# Patient Record
Sex: Female | Born: 1935 | Race: White | Hispanic: No | Marital: Single | Smoking: Current every day smoker
Health system: Southern US, Community
[De-identification: ages and names within clinical notes are randomized; demographics above are authoritative.]

## PROBLEM LIST (undated history)

## (undated) DIAGNOSIS — J449 Chronic obstructive pulmonary disease, unspecified: Secondary | ICD-10-CM

## (undated) DIAGNOSIS — H3530 Unspecified macular degeneration: Secondary | ICD-10-CM

## (undated) HISTORY — PX: NO PAST SURGERIES: SHX2092

## (undated) HISTORY — DX: Unspecified macular degeneration: H35.30

## (undated) HISTORY — DX: Chronic obstructive pulmonary disease, unspecified: J44.9

---

## 2001-02-08 ENCOUNTER — Inpatient Hospital Stay (HOSPITAL_COMMUNITY): Admission: EM | Admit: 2001-02-08 | Discharge: 2001-02-10 | Payer: Self-pay | Admitting: Emergency Medicine

## 2001-02-08 ENCOUNTER — Encounter: Payer: Self-pay | Admitting: Emergency Medicine

## 2003-04-25 ENCOUNTER — Ambulatory Visit (HOSPITAL_COMMUNITY): Admission: RE | Admit: 2003-04-25 | Discharge: 2003-04-25 | Payer: Self-pay | Admitting: Internal Medicine

## 2004-09-09 ENCOUNTER — Ambulatory Visit: Payer: Self-pay | Admitting: Internal Medicine

## 2004-09-16 ENCOUNTER — Ambulatory Visit: Payer: Self-pay | Admitting: Internal Medicine

## 2004-09-18 ENCOUNTER — Ambulatory Visit: Payer: Self-pay | Admitting: Family Medicine

## 2005-03-19 ENCOUNTER — Ambulatory Visit: Payer: Self-pay | Admitting: Internal Medicine

## 2005-03-25 ENCOUNTER — Ambulatory Visit: Payer: Self-pay | Admitting: Internal Medicine

## 2005-04-15 ENCOUNTER — Ambulatory Visit: Payer: Self-pay | Admitting: Internal Medicine

## 2005-05-13 ENCOUNTER — Ambulatory Visit: Payer: Self-pay | Admitting: *Deleted

## 2005-07-09 ENCOUNTER — Ambulatory Visit: Payer: Self-pay | Admitting: Internal Medicine

## 2005-10-27 ENCOUNTER — Ambulatory Visit: Payer: Self-pay | Admitting: Internal Medicine

## 2005-10-29 ENCOUNTER — Ambulatory Visit: Payer: Self-pay | Admitting: Cardiology

## 2005-11-06 ENCOUNTER — Ambulatory Visit: Payer: Self-pay | Admitting: Internal Medicine

## 2006-02-06 ENCOUNTER — Ambulatory Visit: Payer: Self-pay | Admitting: Internal Medicine

## 2006-05-11 ENCOUNTER — Ambulatory Visit: Payer: Self-pay | Admitting: Internal Medicine

## 2006-05-25 ENCOUNTER — Ambulatory Visit: Payer: Self-pay | Admitting: Internal Medicine

## 2006-05-26 LAB — CONVERTED CEMR LAB
ALT: 13 units/L (ref 0–40)
AST: 15 units/L (ref 0–37)
Albumin: 4.2 g/dL (ref 3.5–5.2)
Alkaline Phosphatase: 68 units/L (ref 39–117)
BUN: 12 mg/dL (ref 6–23)
Basophils Absolute: 0.1 10*3/uL (ref 0.0–0.1)
Basophils Relative: 1 % (ref 0.0–1.0)
Bilirubin, Direct: 0.1 mg/dL (ref 0.0–0.3)
CO2: 28 meq/L (ref 19–32)
Calcium: 10.1 mg/dL (ref 8.4–10.5)
Chloride: 105 meq/L (ref 96–112)
Creatinine, Ser: 0.7 mg/dL (ref 0.4–1.2)
Eosinophil percent: 1.6 % (ref 0.0–5.0)
GFR calc non Af Amer: 88 mL/min
Glomerular Filtration Rate, Af Am: 106 mL/min/{1.73_m2}
Glucose, Bld: 90 mg/dL (ref 70–99)
HCT: 32.4 % — ABNORMAL LOW (ref 36.0–46.0)
Hemoglobin: 10.3 g/dL — ABNORMAL LOW (ref 12.0–15.0)
Lymphocytes Relative: 17.8 % (ref 12.0–46.0)
MCHC: 31.8 g/dL (ref 30.0–36.0)
MCV: 75.7 fL — ABNORMAL LOW (ref 78.0–100.0)
Monocytes Absolute: 0.4 10*3/uL (ref 0.2–0.7)
Monocytes Relative: 4.2 % (ref 3.0–11.0)
Neutro Abs: 7 10*3/uL (ref 1.4–7.7)
Neutrophils Relative %: 75.4 % (ref 43.0–77.0)
Platelets: 382 10*3/uL (ref 150–400)
Potassium: 4.9 meq/L (ref 3.5–5.1)
RBC: 4.28 M/uL (ref 3.87–5.11)
RDW: 14.8 % — ABNORMAL HIGH (ref 11.5–14.6)
Sodium: 139 meq/L (ref 135–145)
Total Bilirubin: 0.7 mg/dL (ref 0.3–1.2)
Total Protein: 6.3 g/dL (ref 6.0–8.3)
WBC: 9.3 10*3/uL (ref 4.5–10.5)

## 2006-06-29 ENCOUNTER — Ambulatory Visit: Payer: Self-pay | Admitting: Internal Medicine

## 2007-01-28 ENCOUNTER — Encounter: Payer: Self-pay | Admitting: Internal Medicine

## 2007-02-19 DIAGNOSIS — M949 Disorder of cartilage, unspecified: Secondary | ICD-10-CM

## 2007-02-19 DIAGNOSIS — M899 Disorder of bone, unspecified: Secondary | ICD-10-CM | POA: Insufficient documentation

## 2007-02-19 DIAGNOSIS — D649 Anemia, unspecified: Secondary | ICD-10-CM | POA: Insufficient documentation

## 2007-02-19 DIAGNOSIS — F411 Generalized anxiety disorder: Secondary | ICD-10-CM | POA: Insufficient documentation

## 2007-02-19 DIAGNOSIS — E785 Hyperlipidemia, unspecified: Secondary | ICD-10-CM

## 2008-03-22 ENCOUNTER — Encounter: Payer: Self-pay | Admitting: Internal Medicine

## 2008-07-28 ENCOUNTER — Encounter: Payer: Self-pay | Admitting: Internal Medicine

## 2008-09-04 ENCOUNTER — Telehealth: Payer: Self-pay | Admitting: Family Medicine

## 2008-09-04 ENCOUNTER — Ambulatory Visit: Payer: Self-pay | Admitting: Family Medicine

## 2008-09-04 DIAGNOSIS — J0100 Acute maxillary sinusitis, unspecified: Secondary | ICD-10-CM | POA: Insufficient documentation

## 2010-11-01 NOTE — Discharge Summary (Signed)
[place]  Patient:    Lydia Carr, Lydia Carr Visit Number: [id number] MRN: [id number]          Service Type: MED Location: [phone] Attending Physician:  Judie Petit Dictated by:   Corwin Levins, M.D. [facility] Admit Date:  02/08/2001 Discharge Date: 02/10/2001                             Discharge Summary  DISCHARGE DIAGNOSES: 1. Anemia. 2. Lower gastrointestinal bleeding. 3. History of hemorrhoidectomy.  CONSULTATIONS:  Dr. Wilhemina Bonito. Eda Keys., Gastroenterology  PROCEDURE:  Colonoscopy, February 10, 2001, for internal hemorrhoids, otherwise normal examination without complication.  HISTORY OF PRESENT ILLNESS:  See that dictated by Dr. Cato Mulligan on the day of admission.  HOSPITAL COURSE:  Ms. Lydia Carr is a very nice 77 year old white female with history of hemorrhoidectomy and anemia secondary to hemorrhoidal bleeding requiring transfusion in 1997 with a colonoscopy at that time, otherwise essentially within normal limits. Since that time, she had occasional bright red blood per rectum and in the last two years, it seemed the bleeding has accelerated. She has been passing blood with almost all bowel movements sometimes three or four times a day. The blood kind of reddened the entire commode, sometime passed pure blood without stools.  She was admitted February 08, 2001 for the above with fatigue and anemia. Hemoglobin 6.4. She was transfused 2 units of packed red blood cells, found to be iron deficient on lab testing.  Gastroenterology consulted, colonoscopy performed with results as above.   On the 28th, after colonoscopy, she otherwise felt well with marked improvement.  On discharge, hemoglobin 9.8 and no other significant problems.   She was felt to have gained maximal benefit from this hospitalization and was to be discharged.  CONDITION ON DISCHARGE:  The patient was discharged to home in good condition.  DISCHARGE MEDICATIONS: 1.  Nu-Iron 150 mg 1-2 times per day. 2. Anusol-HC suppositories. 3. Metamucil on a daily basis.  INSTRUCTIONS:  She was instructed to eat iron-rich foods.  No strenuous exercise for 10 days.  Otherwise no other dietary or activity restrictions.  FOLLOW-UP:  She was asked to make a follow-up appointment with Dr. Cato Mulligan in two weeks to check her blood count and call Dr. Marina Goodell for any problems such as further persistent rectal bleeding.Dictated by:   Corwin Levins, M.D. [facility]  Attending Physician:  Judie Petit DD:  03/31/01 TD:  03/31/01 Job: [id number]

## 2010-11-01 NOTE — Assessment & Plan Note (Signed)
[facility]                         GASTROENTEROLOGY OFFICE NOTE   Lydia Carr, Lydia Carr                    MRN:          [id number]  DATE:06/29/2006                            DOB:          09-20-1932    REFERRING PHYSICIAN:  Valetta Mole. Swords, MD   REASON FOR CONSULTATION:  Change in bowel habits, rectal bleeding and  anemia.   HISTORY:  This is a 77 year old female with a history of recurrent iron  deficiency anemia and rectal bleeding due to hemorrhoids. For this  problem, she underwent complete colonoscopy in 1987 and again in 2002.  No abnormalities other than hemorrhoids noted. She was evaluated by Dr.  Ovidio Kin of general surgery for treatment though apparently did not  follow through. She reports being in her usual state of good health  until November 2007 when on two consecutive evenings after dining out  developed problems with diarrhea that persisted through the night. Since  that time, she has had persistent problems with increased intestinal  bowel sounds, increased intestinal gas with pain, urgency and diarrhea.  She has also noticed blood in the commode. Evaluation with Dr. Cato Mulligan  May 26, 2006 included blood work. She was anemic with a hemoglobin  of 10.3. Her indices were microcytic with an MCV of 75.7. She was  treated with iron b.i.d. She has also modified her diet avoiding milk  products and raw vegetables. She states that her condition has improved  somewhat. She has not had prior upper endoscopy. She has had some nausea  though no vomiting. No heartburn or dysphagia. She denies nonsteroidal  antiinflammatory drug use. No prior history of problems with her bowel  habits.   PAST MEDICAL HISTORY:  None.   PAST SURGICAL HISTORY:  Hysterectomy in 1969, hemorrhoid surgery in  1985.   ALLERGIES:  LANCOSIN.   CURRENT MEDICATIONS:  Levsin sublingual 0.125 mg p.r.n., multivitamin,  calcium and iron supplement b.i.d.   FAMILY HISTORY:  No family history of gastrointestinal malignancy or  inflammatory bowel disease.   SOCIAL HISTORY:  The patient is widowed with 2 sons who are grown, she  lives alone. She was a housewife. She smokes 3/4 of a pack of cigarettes  a day. She occasionally uses alcohol.   REVIEW OF SYSTEMS:  Per diagnostic evaluation form.   PHYSICAL EXAMINATION:  GENERAL:  Well-appearing female in no acute  distress.  VITAL SIGNS:  Blood pressure is 130/76, heart rate is 80 and regular.  Weight is 123 pounds. She is 5 feet 3 inches in height.  HEENT:  Sclera are anicteric. Conjunctiva are pink. Oral mucosa is  intact. There is no adenopathy.  LUNGS:  Clear.  HEART:  Regular.  ABDOMEN:  Soft without tenderness, mass or hernia. Good bowel sounds  heard.  EXTREMITIES:  Without edema.   IMPRESSION:  This is a 77 year old female who presents with a 2 month  history of change in bowel habits as manifested by diarrhea and urgency  as well as abdominal discomfort and rectal bleeding. She also has iron  deficiency anemia. New onset inflammatory bowel disease should be  excluded. Alternatively  this may represent a protracted infectious  process which is improving and the intermittent bleeding,and possibly  the anemia, simply due to known hemorrhoids as in the past.   RECOMMENDATIONS:  1. Colonoscopy to evaluate change in bowel habits, rectal bleeding and      iron deficiency anemia. The nature of the procedure as well as the      risks, benefits, and alternatives have been reviewed. She      understood and agreed to proceed.  2. If colonoscopy is not completely revealing then upper endoscopy      with small intestinal biopsies to evaluate iron deficiency anemia      and diarrhea (i.e. rule out sprue).     Wilhemina Bonito. Marina Goodell, MD  Electronically Signed    JNP/MedQ  DD: 06/29/2006  DT: 06/29/2006  Job #: [id number]   cc:   Valetta Mole. Swords, MD

## 2010-11-01 NOTE — H&P (Signed)
[facility]  Patient:    Lydia Carr, Lydia Carr Visit Number: [id number] MRN: [id number]          Service Type: MED Location: [phone] Attending Physician:  Judie Petit Dictated by:   Valetta Mole Swords, M.D. [facility] Adm. Date:  02/08/2001   CC:         Wilhemina Bonito. Eda Keys., M.D. [facility]   History and Physical  CHIEF COMPLAINT:  This is a 77 year old female with chief complaint of "feels lousy."  HISTORY OF PRESENT ILLNESS:  Ms. Tooley is a relatively healthy 77 year old female who has a two-year history of bright red blood per rectum.  She has not sought any medical attention for this.  She has a history of being profoundly anemic in 1987, which was thought to be secondary to bleeding hemorrhoids. Apparently, she had a colonoscopy at that time that was negative except for hemorrhoids.  She had a surgical procedure at that time.  Now, she has a two-year history of intermittent rectal bleeding.  The bleeding can be quite pronounced.  Over the past three to four days, she has felt excessively tired, fatigued and short of breath.  This has been a gradual worsening of her symptoms.  Now, she will feel lightheaded occasionally when she stands up.  PAST MEDICAL HISTORY: 1. Hemorrhoid surgery in 1987. 2. Knee arthroscopy. 3. Hysterectomy at age 60. 4. Bilateral salpingo-oophorectomy.  MEDICATIONS:  No medications.  No over-the-counter medications and no nonsteroidals.  SOCIAL HISTORY:  She is married and recently moved to [place].  She quit smoking two to three months ago.  She drinks alcohol rarely.  She has two healthy children.  FAMILY HISTORY:  Father deceased at 72.  Mother deceased at 19.  She has one brother who is healthy.  REVIEW OF SYSTEMS:  She denies any chest pain, PND, orthopnea, denies any abdominal pain and denies any other complaints other than those listed above.  PHYSICAL EXAMINATION:  VITAL SIGNS:  Blood pressure 126/59, pulse  107, respiratory rate 20, temperature 97.8.  GENERAL:  She appears as well-developed, well-nourished female in no acute distress, mildly pale-appearing.  HEENT:  Conjunctivae are pale.  Atraumatic, normocephalic, extraocular movements intact.  NECK:  Supple without lymphadenopathy, jugular venous distention or carotid bruits.  CHEST:  Clear to auscultation without any increased work of breathing.  CARDIAC:  S1, S2 are normal without murmurs or gallop.  ABDOMEN:  Active bowel sounds, soft, nontender.  There is no hepatosplenomegaly, no masses or palpated.  EXTREMITIES:  No clubbing, cyanosis, or edema.  RECTAL:  Heme positive stool.  NEUROLOGIC:  She is alert and oriented.  LABORATORY DATA AND X-RAY FINDINGS:  CMET is normal.  Hemoglobin 6.5, white count 9.4, hematocrit 20.2, platelet count 418,000.  Amylase 80.  EKG was performed and demonstrates normal sinus rhythm and is a normal EKG.  ASSESSMENT/PLAN:  Profound anemia as cause of her systemic symptoms.  Given her history, it is most likely that this is hemorrhoidal bleeding, although it seems like an excessive amount of bleeding.  She needs GI evaluation and likely needs a full colonoscopy.  I have talked with Dr. Yancey Flemings who will see the patient.  We will obtain iron studies to prove iron deficiency.  She needs transfusion given her symptoms.  She understands the risks and will do that tonight. Dictated by:   Valetta Mole Swords, M.D. [facility] Attending Physician:  Judie Petit DD:  02/08/01 TD:  02/08/01 Job: [id number]

## 2014-11-07 ENCOUNTER — Other Ambulatory Visit: Payer: Self-pay | Admitting: Family Medicine

## 2014-11-07 ENCOUNTER — Ambulatory Visit
Admission: RE | Admit: 2014-11-07 | Discharge: 2014-11-07 | Disposition: A | Discharge status: Home / Self Care | Payer: Medicare Other | Source: Ambulatory Visit | Attending: Family Medicine | Admitting: Family Medicine

## 2014-11-07 DIAGNOSIS — M545 Low back pain, unspecified: Secondary | ICD-10-CM

## 2014-11-08 ENCOUNTER — Ambulatory Visit
Admission: RE | Admit: 2014-11-08 | Discharge: 2014-11-08 | Disposition: A | Discharge status: Home / Self Care | Payer: Medicare Other | Source: Ambulatory Visit | Attending: Family Medicine | Admitting: Family Medicine

## 2014-11-08 DIAGNOSIS — M545 Low back pain: Secondary | ICD-10-CM

## 2014-11-15 ENCOUNTER — Other Ambulatory Visit: Payer: Self-pay | Admitting: Family Medicine

## 2014-11-15 DIAGNOSIS — M545 Low back pain, unspecified: Secondary | ICD-10-CM

## 2014-11-28 ENCOUNTER — Ambulatory Visit
Admission: RE | Admit: 2014-11-28 | Discharge: 2014-11-28 | Disposition: A | Discharge status: Home / Self Care | Payer: Medicare Other | Source: Ambulatory Visit | Attending: Family Medicine | Admitting: Family Medicine

## 2014-11-28 DIAGNOSIS — M79604 Pain in right leg: Secondary | ICD-10-CM

## 2014-11-28 DIAGNOSIS — M545 Low back pain: Secondary | ICD-10-CM

## 2014-11-28 MED ORDER — METHYLPREDNISOLONE ACETATE 40 MG/ML INJ SUSP (RADIOLOG
120.0000 mg | Freq: Once | INTRAMUSCULAR | Status: AC
  Administered 2014-11-28: 120 mg via EPIDURAL

## 2014-11-28 MED ORDER — IOHEXOL 180 MG/ML  SOLN
1.0000 mL | Freq: Once | INTRAMUSCULAR | Status: AC | PRN
  Administered 2014-11-28: 1 mL via EPIDURAL

## 2014-11-28 NOTE — Discharge Instructions (Signed)

## 2015-10-25 ENCOUNTER — Other Ambulatory Visit: Payer: Self-pay | Admitting: Family Medicine

## 2015-10-25 ENCOUNTER — Ambulatory Visit
Admission: RE | Admit: 2015-10-25 | Discharge: 2015-10-25 | Disposition: A | Discharge status: Home / Self Care | Payer: Medicare Other | Source: Ambulatory Visit | Attending: Family Medicine | Admitting: Family Medicine

## 2015-10-25 DIAGNOSIS — S42111D Displaced fracture of body of scapula, right shoulder, subsequent encounter for fracture with routine healing: Secondary | ICD-10-CM

## 2016-05-19 IMAGING — CR DG SCAPULA*R*
2 series · 2 of 2 positions shown · non-contrast
Comparison: None.

CLINICAL DATA: Palpable knot of right scapula.

EXAM:
RIGHT SCAPULA - 2+ VIEWS

[w scapula ap right]
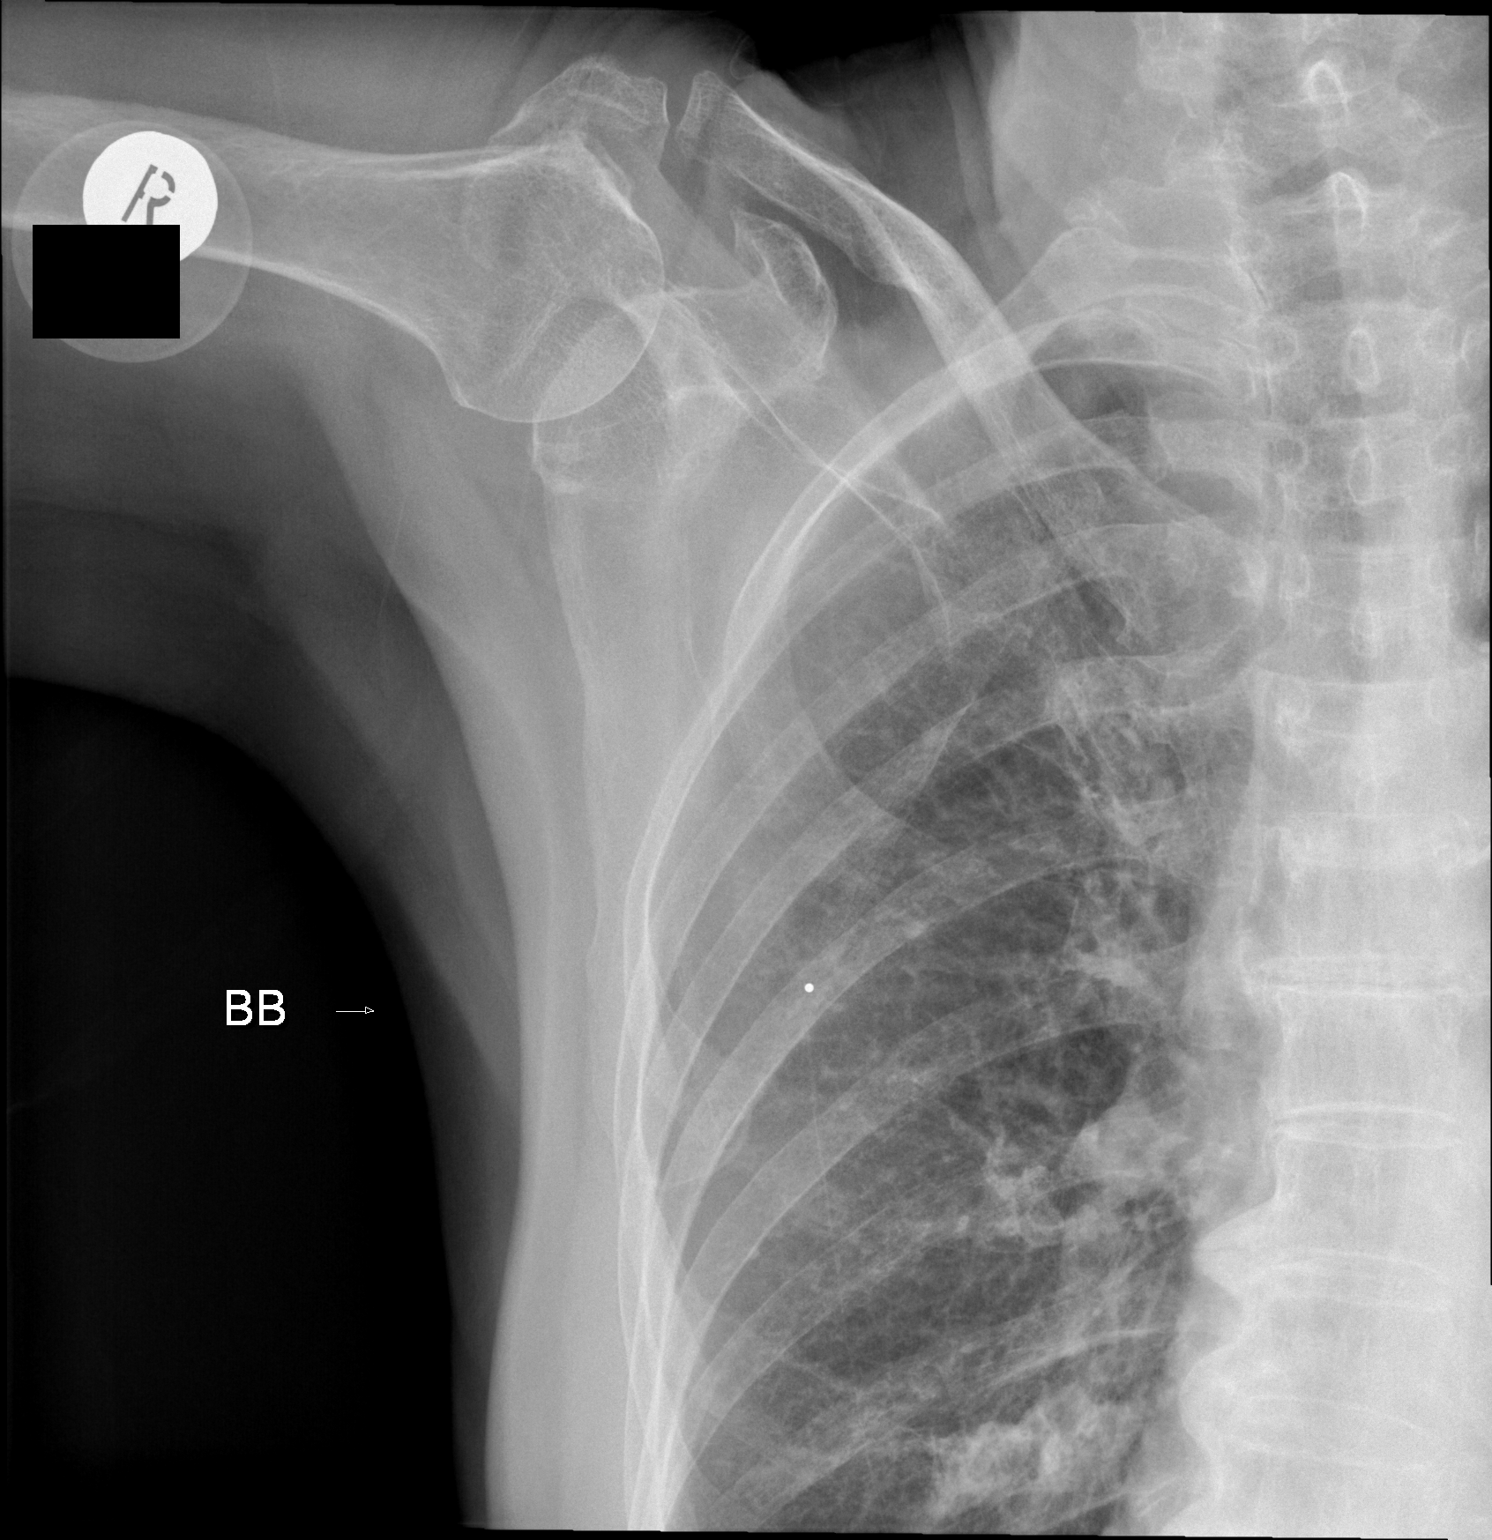

[w scapula y-view right]
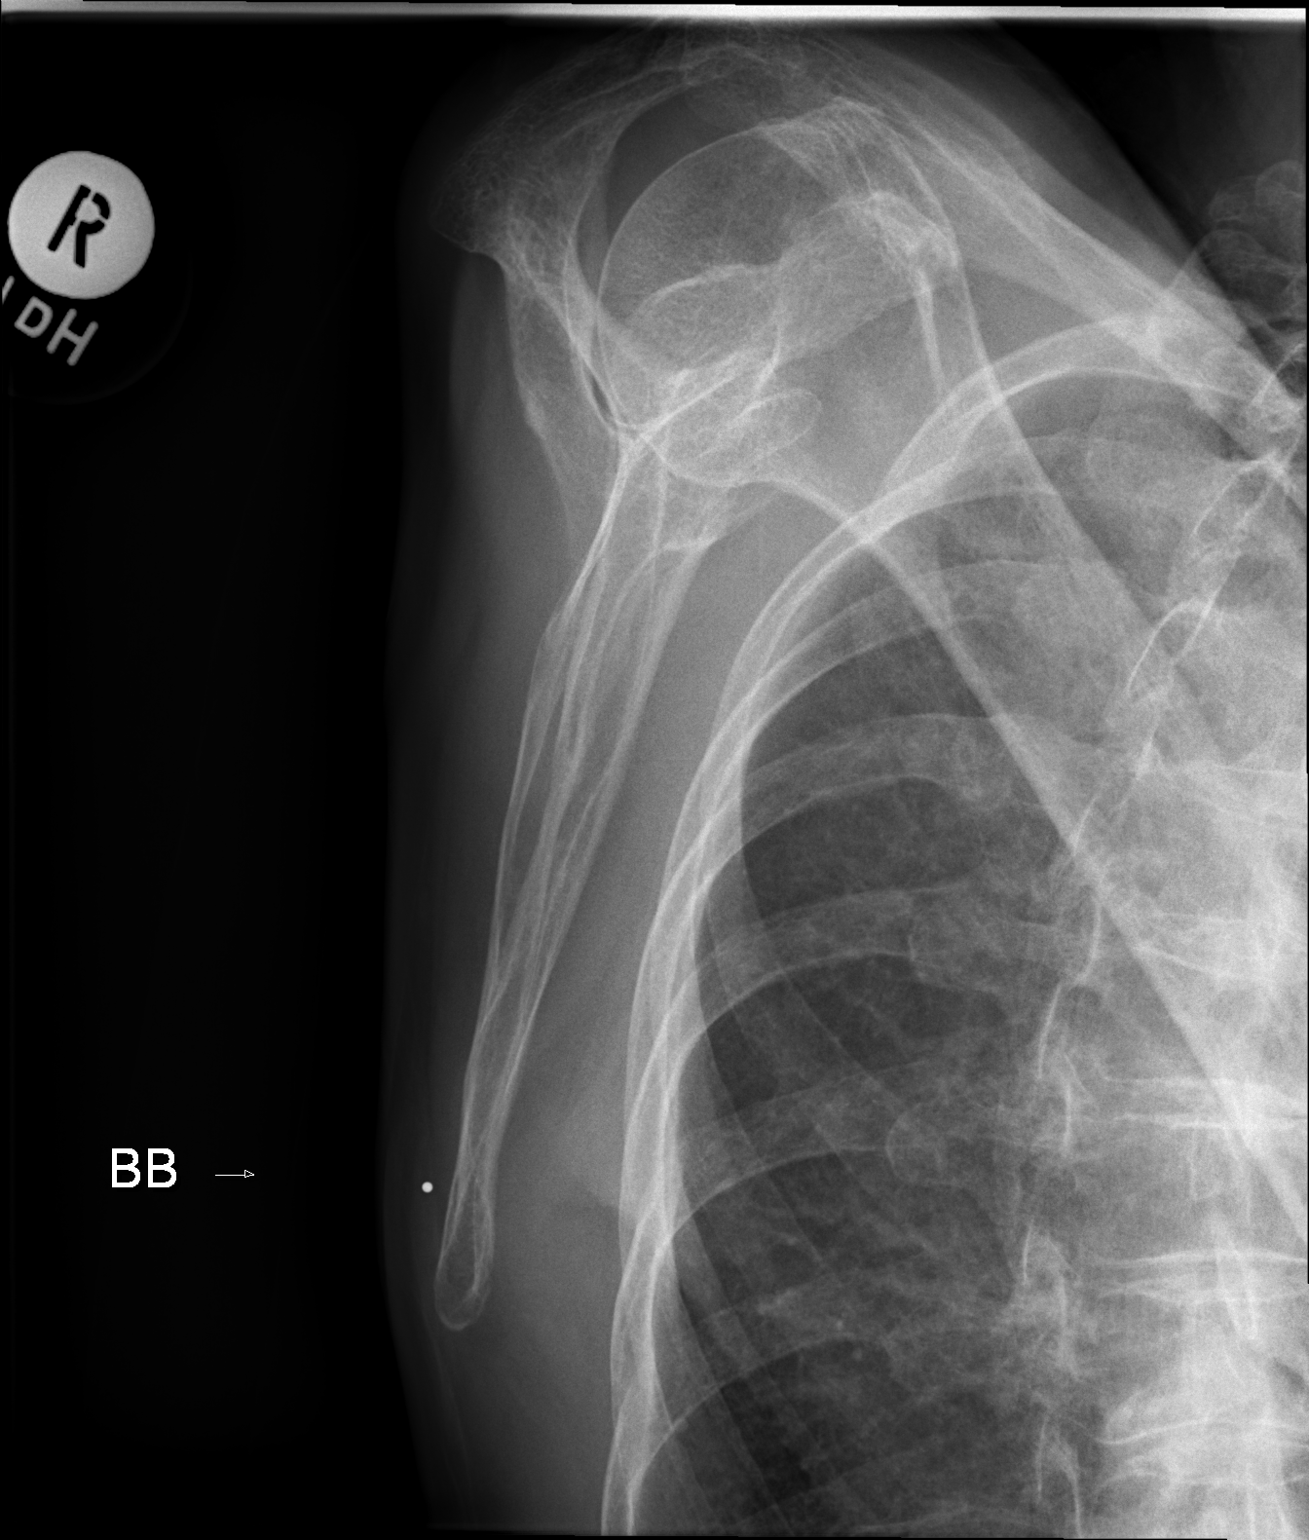

[2 of 2 positions shown; findings below may reference images not displayed]

FINDINGS: There is no evidence of fracture or other focal bone lesions. Soft
tissues are unremarkable.
IMPRESSION: Normal right scapula.

## 2017-03-09 ENCOUNTER — Encounter: Payer: Self-pay | Admitting: Neurology

## 2017-03-09 ENCOUNTER — Ambulatory Visit (INDEPENDENT_AMBULATORY_CARE_PROVIDER_SITE_OTHER): Payer: Medicare Other | Admitting: Neurology

## 2017-03-09 ENCOUNTER — Encounter (INDEPENDENT_AMBULATORY_CARE_PROVIDER_SITE_OTHER): Payer: Self-pay

## 2017-03-09 VITALS — BP 118/66 | HR 92 | Ht 61.5 in | Wt 96.8 lb

## 2017-03-09 DIAGNOSIS — F0390 Unspecified dementia without behavioral disturbance: Secondary | ICD-10-CM

## 2017-03-09 DIAGNOSIS — F0280 Dementia in other diseases classified elsewhere without behavioral disturbance: Secondary | ICD-10-CM

## 2017-03-09 DIAGNOSIS — R413 Other amnesia: Secondary | ICD-10-CM | POA: Diagnosis not present

## 2017-03-09 DIAGNOSIS — G301 Alzheimer's disease with late onset: Secondary | ICD-10-CM | POA: Diagnosis not present

## 2017-03-09 MED ORDER — DONEPEZIL HCL 5 MG PO TABS: 5.0000 mg | ORAL_TABLET | Freq: Every day | ORAL | 11 refills | Status: DC

## 2017-03-09 NOTE — Progress Notes (Signed)
[facility]    Provider:  Dr Lucia Gaskins Referring Provider: Mosetta Putt, MD Primary Care Physician:  Mosetta Putt, MD  CC:  dementia  HPI:  Lydia Carr is a 83 y.o. female here as a referral from Dr. Duaine Dredge for dementia.PMHx COPD. Here with son and daughter in law. Patient feels like she is losing her memory. Started about 2 years ago and progressive. Patient says she started with having problems with dates, appointments, she had to write everything down. She can't remember how to cook things. She can't remember ingredients and has difficulty with executive function. No accidents. She gets lost if she goes too far but only drives 2 miles from her home. She is having more confusion. She repeats things. She lives independently in a town home, son helps her with finances. She forgets names. Repeats things in the same phone conversation. Denies any FHx of dementia, dad lived until 100 and possibly had memory issues unclear when it started. Mother was 82 when she died no know FHx. No alcohol use, no drug use or previous history of addiction. She is a current smoker. 1PPD, 65 pack years. More short term memory loss reported. Son and daughter-in-law provide much information. No other focal neurologic deficits, associated symptoms, inciting events or modifiable factors.   Reviewed notes, labs and imaging from outside physicians, which showed:  Reviewed pcp notes: MMSE 21/30 in June of this year. Patient's symptoms have been developing over the last 2 years particularly since of apathy the hand and. She also has anxiety over the last few months and is on alprazolam. She has a number of other chronic problems COPD, impaired tolerance constipation, osteopenia, hypercholesterolemia and degenerative disc disease of the L5 spine.  Personally reviewed imaging and agree with the following CT of the head May 2007:  1. No acute findings.  2. Patchy low attenuation in the  periventricular white matter is likely due to chronic microvascular ischemic changes. MR, as clinically indicated.  Review of Systems: Patient complains of symptoms per HPI as well as the following symptoms: Weight loss, cough, memory loss, confusion, anxiety, change Pertinent negatives and positives per HPI. All others negative.   Social History   Social History  . Marital status: Single    Spouse name: N/A  . Number of children: N/A  . Years of education: N/A   Occupational History  . Not on file.   Social History Main Topics  . Smoking status: Current Every Day Smoker    Packs/day: 1.00  . Smokeless tobacco: Not on file  . Alcohol use No  . Drug use: No  . Sexual activity: Not on file   Other Topics Concern  . Not on file   Social History Narrative  . No narrative on file    Family History  Problem Relation Age of Onset  . Dementia Neg Hx     Past Medical History:  Diagnosis Date  . COPD (chronic obstructive pulmonary disease) (HCC)     Past Surgical History:  Procedure Laterality Date  . NO PAST SURGERIES      Current Outpatient Prescriptions  Medication Sig Dispense Refill  . ALPRAZolam (XANAX) 0.25 MG tablet Take 0.25 mg by mouth at bedtime as needed.    . Cholecalciferol (VITAMIN D3 PO) Take 2,000 Int'l Units by mouth daily.    . polyethylene glycol powder (GLYCOLAX/MIRALAX) powder Take 1 Container by mouth daily as needed.    . vitamin B-12 (CYANOCOBALAMIN) 1000 MCG tablet Take 1,000 mcg  by mouth daily.    . vitamin C (ASCORBIC ACID) 500 MG tablet Take 500 mg by mouth daily.    Marland Kitchen donepezil (ARICEPT) 5 MG tablet Take 1 tablet (5 mg total) by mouth at bedtime. 30 tablet 11   No current facility-administered medications for this visit.     Allergies as of 03/09/2017 - Review Complete 03/09/2017  Allergen Reaction Noted  . Penicillins Swelling 03/09/2017    Vitals: BP 118/66   Pulse 92   Ht 5' 1.5" (1.562 m)   Wt 96 lb 12.8 oz (43.9 kg)   BMI  17.99 kg/m  Last Weight:  Wt Readings from Last 1 Encounters:  03/09/17 96 lb 12.8 oz (43.9 kg)   Last Height:   Ht Readings from Last 1 Encounters:  03/09/17 5' 1.5" (1.562 m)   Physical exam: Exam: Gen: NAD, pleasant           CV: RRR, no MRG. No Carotid Bruits. No peripheral edema, warm, nontender Eyes: Conjunctivae clear without exudates or hemorrhage  Neuro: Detailed Neurologic Exam  Speech:    Speech is normal; fluent and spontaneous with normal comprehension.  Cognition:  MMSE - Mini Mental State Exam 03/09/2017  Orientation to time 3  Orientation to Place 4  Registration 3  Attention/ Calculation 1  Recall 0  Language- name 2 objects 2  Language- repeat 1  Language- follow 3 step command 3  Language- read & follow direction 1  Write a sentence 1  Copy design 1  Total score 20   Cranial Nerves:    The pupils are equal, round, and reactive to light. Attempted funduscopic exam could not visualize due to small pupils. Visual fields are full to finger confrontation. Extraocular movements are intact. Trigeminal sensation is intact and the muscles of mastication are normal. The face is symmetric. The palate elevates in the midline. Hearing intact. Voice is normal. Shoulder shrug is normal. The tongue has normal motion without fasciculations.   Coordination:    No dysmetria  Gait:    Not ataxic, normal native gait  Motor Observation:    No asymmetry, no atrophy, and no involuntary movements noted. Tone:    Normal muscle tone.    Posture:    Posture is normal. normal erect    Strength:    Strength is V/V in the upper and lower limbs.      Sensation: intact to LT     Reflex Exam:  DTR's:    Deep tendon reflexes in the upper and lower extremities are symmetrical bilaterally.   Toes:    The toes are equivocal and bilaterally.   Clonus:    Clonus is absent.       Assessment/Plan:  This is an 83 year old female with progressive memory loss more  short-term memory and executive function difficulties. Exam is nonfocal. Likely Alzheimer's type dementia, we'll initiate workup including MRI of the brain, formal neurocognitive testing and we will start Aricept. MMSE is 20 out of 30. We'll start Aricept. B12, TSH were completed primary care physician's office will request results.  Orders Placed This Encounter  Procedures  . MR BRAIN WO CONTRAST  . Ambulatory referral to Neuropsychology     Naomie Dean, MD  [facility] [address] [place], [place] [address]  Phone [phone] Fax [phone]

## 2017-03-09 NOTE — Patient Instructions (Addendum)
Remember to drink plenty of fluid, eat healthy meals and do not skip any meals. Try to eat protein with a every meal and eat a healthy snack such as fruit or nuts in between meals. Try to keep a regular sleep-wake schedule and try to exercise daily, particularly in the form of walking, 20-30 minutes a day, if you can.   As far as your medications are concerned, I would like to suggest: Aricept   As far as diagnostic testing: MRI brain and formal neurocognitive testing, Driver evaluation  I would like to see you back in 4 months, sooner if we need to. Please call us with any interim questions, concerns, problems, updates or refill requests.   Our phone number is [phone]. We also have an after hours call service for urgent matters and there is a physician on-call for urgent questions. For any emergencies you know to call 911 or go to the nearest emergency room   Living With Alzheimer Disease Alzheimer disease is a brain disease that makes you forget things that you used to know. It also makes it hard to pay attention, communicate, and do routine tasks. Alzheimer disease gets worse over time. At the start of the disease, you may be able to take care of yourself, but you will eventually need someone to help care for you. How to cope with changes It is normal to have many emotions about this condition, such as fear, sadness, anger, and loss. Here are some ways to help yourself cope with these emotions:  Accept your emotions.  Write down your thoughts and feelings in a journal.  Build a supportive group of friends and family to help you.  Join a support group for people with Alzheimer disease.  The changes caused by Alzheimer disease can be frightening and confusing. Here are some things you can do to make adjusting to these changes a little easier.  Keep a daily routine.  Keep a calendar in a central location. Write down your appointments and activities on the calendar.  Keep a list  of things you need to do.  Focus on one task at a time.  Organize medicines in a pillbox for each day of the week.  Accept that some things may need to change for your safety, such as driving.  Accept help from others. Do not be ashamed if you need help with certain tasks.  Think about what is most stressful for you. Then find ways to change or avoid these things if possible. Ask for help from others to help relieve stress.  Create a plan for any legal or financial actions that are needed. Get professional advice if you are not sure what should be done.  How to recognize stress It is normal to feel stressed from time to time. Here are some signs that you are feeling stressed:  Avoiding contact with other people.  Anger or frustration.  Denying that you have the disease.  Trouble sleeping.  Trouble concentrating.  Irritability.  Anxiety.  Depression.  Developing other health problems.  Follow these instructions at home:  Create a bedtime and sleeping routine that includes: ? Sleeping in a cool bedroom. ? Using darkening shades. ? No physical activity or eating for a few hours before bedtime.  Get regular exercise.  Use safety devices, such as a cane or walker, if you have trouble balancing.  Have a safety plan for emergencies. Consider using a safety alert system that allows you to get help quickly.  Take  steps to manage your stress. Try any of the following: ? Listen to music. ? Spend time with others. ? Talk about how you are feeling. ? Do creative artwork. ? Do meditation and deep breathing exercises.  Avoid caffeine and alcohol.  Take over-the-counter and prescription medicines only as told by your health care provider. Where to find support:  Friends and family.  Your place of worship.  Counselors or therapists.  Support groups.  Home health care services. Where to find more information: Alzheimer's Association: Limit[employer]hu Contact a health  care provider if:  You are unable to care for yourself.  You feel that you are in danger. Get help right away if:  You have feelings of harming yourself.  You feel depressed. If you ever feel like you may hurt yourself or others, or have thoughts about taking your own life, get help right away. You can go to your nearest emergency department or call:  Your local emergency services (911 in the [place]  A suicide crisis helpline, such as the National Suicide Prevention Lifeline at 713 475 5027. This is open 24 hours a day.  Summary  Alzheimer disease is a brain disease that makes you forget things that you used to know. It also makes it hard to pay attention, communicate, and do routine tasks.  Alzheimer disease gets worse over time. At the start of the disease, you may be able to take care of yourself, but you will eventually need someone to help care for you.  The changes caused by Alzheimer disease can be frightening and confusing. You may need to make changes in your daily routine. Build a supportive group of friends and family to help you. This information is not intended to replace advice given to you by your health care provider. Make sure you discuss any questions you have with your health care provider. Document Released: 05/22/2016 Document Revised: 05/22/2016 Document Reviewed: 05/22/2016 Elsevier Interactive Patient Education  2018 [employer].  Donepezil tablets What is this medicine? DONEPEZIL (doe NEP e zil) is used to treat mild to moderate dementia caused by Alzheimer's disease. This medicine may be used for other purposes; ask your health care provider or pharmacist if you have questions. COMMON BRAND NAME(S): Aricept What should I tell my health care provider before I take this medicine? They need to know if you have any of these conditions: -asthma or other lung disease -difficulty passing urine -head injury -heart disease -history of irregular  heartbeat -liver disease -seizures (convulsions) -stomach or intestinal disease, ulcers or stomach bleeding -an unusual or allergic reaction to donepezil, other medicines, foods, dyes, or preservatives -pregnant or trying to get pregnant -breast-feeding How should I use this medicine? Take this medicine by mouth with a glass of water. Follow the directions on the prescription label. You may take this medicine with or without food. Take this medicine at regular intervals. This medicine is usually taken before bedtime. Do not take it more often than directed. Continue to take your medicine even if you feel better. Do not stop taking except on your doctor's advice. If you are taking the 23 mg donepezil tablet, swallow it whole; do not cut, crush, or chew it. Talk to your pediatrician regarding the use of this medicine in children. Special care may be needed. Overdosage: If you think you have taken too much of this medicine contact a poison control center or emergency room at once. NOTE: This medicine is only for you. Do not share this medicine with others. What  if I miss a dose? If you miss a dose, take it as soon as you can. If it is almost time for your next dose, take only that dose, do not take double or extra doses. What may interact with this medicine? Do not take this medicine with any of the following medications: -certain medicines for fungal infections like itraconazole, fluconazole, posaconazole, and voriconazole -cisapride -dextromethorphan; quinidine -dofetilide -dronedarone -pimozide -quinidine -thioridazine -ziprasidone This medicine may also interact with the following medications: -antihistamines for allergy, cough and cold -atropine -bethanechol -carbamazepine -certain medicines for bladder problems like oxybutynin, tolterodine -certain medicines for Parkinson's disease like benztropine, trihexyphenidyl -certain medicines for stomach problems like dicyclomine,  hyoscyamine -certain medicines for travel sickness like scopolamine -dexamethasone -ipratropium -NSAIDs, medicines for pain and inflammation, like ibuprofen or naproxen -other medicines for Alzheimer's disease -other medicines that prolong the QT interval (cause an abnormal heart rhythm) -phenobarbital -phenytoin -rifampin, rifabutin or rifapentine This list may not describe all possible interactions. Give your health care provider a list of all the medicines, herbs, non-prescription drugs, or dietary supplements you use. Also tell them if you smoke, drink alcohol, or use illegal drugs. Some items may interact with your medicine. What should I watch for while using this medicine? Visit your doctor or health care professional for regular checks on your progress. Check with your doctor or health care professional if your symptoms do not get better or if they get worse. You may get drowsy or dizzy. Do not drive, use machinery, or do anything that needs mental alertness until you know how this drug affects you. What side effects may I notice from receiving this medicine? Side effects that you should report to your doctor or health care professional as soon as possible: -allergic reactions like skin rash, itching or hives, swelling of the face, lips, or tongue -feeling faint or lightheaded, falls -loss of bladder control -seizures -signs and symptoms of a dangerous change in heartbeat or heart rhythm like chest pain; dizziness; fast or irregular heartbeat; palpitations; feeling faint or lightheaded, falls; breathing problems -signs and symptoms of infection like fever or chills; cough; sore throat; pain or trouble passing urine -signs and symptoms of liver injury like dark yellow or brown urine; general ill feeling or flu-like symptoms; light-colored stools; loss of appetite; nausea; right upper belly pain; unusually weak or tired; yellowing of the eyes or skin -slow heartbeat or  palpitations -unusual bleeding or bruising -vomiting Side effects that usually do not require medical attention (report to your doctor or health care professional if they continue or are bothersome): -diarrhea, especially when starting treatment -headache -loss of appetite -muscle cramps -nausea -stomach upset This list may not describe all possible side effects. Call your doctor for medical advice about side effects. You may report side effects to FDA at 1-800-FDA-1088. Where should I keep my medicine? Keep out of reach of children. Store at room temperature between 15 and 30 degrees C (59 and 86 degrees F). Throw away any unused medicine after the expiration date. NOTE: This sheet is a summary. It may not cover all possible information. If you have questions about this medicine, talk to your doctor, pharmacist, or health care provider.  2018 Elsevier/Gold Standard (2015-11-19 21:00:42)

## 2017-03-11 ENCOUNTER — Encounter: Payer: Self-pay | Admitting: Neurology

## 2017-03-11 DIAGNOSIS — F0390 Unspecified dementia without behavioral disturbance: Secondary | ICD-10-CM | POA: Insufficient documentation

## 2017-03-16 ENCOUNTER — Encounter: Payer: Self-pay | Admitting: Neurology

## 2017-03-16 ENCOUNTER — Encounter: Payer: Self-pay | Admitting: Psychology

## 2017-03-28 ENCOUNTER — Other Ambulatory Visit: Payer: Medicare Other

## 2017-04-11 ENCOUNTER — Ambulatory Visit
Admission: RE | Admit: 2017-04-11 | Discharge: 2017-04-11 | Disposition: A | Discharge status: Home / Self Care | Payer: Medicare Other | Source: Ambulatory Visit | Attending: Neurology | Admitting: Neurology

## 2017-04-11 DIAGNOSIS — R413 Other amnesia: Secondary | ICD-10-CM

## 2017-04-11 DIAGNOSIS — F0390 Unspecified dementia without behavioral disturbance: Secondary | ICD-10-CM

## 2017-04-16 ENCOUNTER — Other Ambulatory Visit: Payer: Self-pay | Admitting: Neurology

## 2017-04-16 ENCOUNTER — Telehealth: Payer: Self-pay | Admitting: *Deleted

## 2017-04-16 DIAGNOSIS — I619 Nontraumatic intracerebral hemorrhage, unspecified: Secondary | ICD-10-CM

## 2017-04-16 DIAGNOSIS — I608 Other nontraumatic subarachnoid hemorrhage: Secondary | ICD-10-CM

## 2017-04-16 DIAGNOSIS — Q283 Other malformations of cerebral vessels: Secondary | ICD-10-CM

## 2017-04-16 NOTE — Telephone Encounter (Signed)
Pt's son called back. He states that they want to move forward with a CT-A and corresponding labs. An appt was made Thursday 06/04/2017 @ 8:30 with an arrival time of 8:00. He would like for the CT-A to be done on the weekend at [facility] if at all possible (the pt's previous MRI was on the weekend). I informed him that when CT is scheduled, they can come to our office at any time during business hours before the CT to have labs drawn. He verbalized understanding.

## 2017-04-16 NOTE — Telephone Encounter (Signed)
-----   Message from Anson FretAntonia B Ahern, MD sent at 04/15/2017 11:41 AM EDT ----- MRi of the brain shows a lot of white matter changes which can be seen in aging but also with vascular risk factors such as HTN, smoking, diabetes, high cholesterol. There is a small amout of blood products there which could be benign but also may be due to some other things we need to discuss. Can you ask them to follow up in the office in the next month please so we can discuss? In the meantime I would liek to order a CTA of the head to look at the blood vessels, if they agree let me know. I will need a current BUN and Creatinine if she has not had that in the last 2-3 months I will place an order. Please let me know what they say thanks thanks

## 2017-04-16 NOTE — Telephone Encounter (Signed)
Noted, thank you

## 2017-04-16 NOTE — Telephone Encounter (Signed)
I called and spoke with pt's son, [place]ydia Carr ([place] I informed him of Dr. Trevor MaceAhern's result comments.  I also offered to make the patient an appt in one month per Dr. Trevor MaceAhern's recommendation to review/discuss results. He verbalized understanding but no appt was finalized during this call. He stated he would discuss with his wife and the patient and then call us back before deciding if CT-A head and BUN/Creat should be ordered or not.   When he calls back, if he agrees, please go ahead and schedule an appt in December.

## 2017-04-16 NOTE — Telephone Encounter (Signed)
Placed thanks

## 2017-04-20 ENCOUNTER — Encounter (INDEPENDENT_AMBULATORY_CARE_PROVIDER_SITE_OTHER): Payer: Medicare Other | Admitting: Ophthalmology

## 2017-04-21 NOTE — Telephone Encounter (Signed)
Called pt's son Nigel BridgemanCurry Hammonds ([place] and rescheduled appt for Friday May 29, 2017 11:00 with an arrival time of 10:30. His questions were answered. I reviewed stroke signs & symptoms and to seek emergency help for any symptoms. He verbalized understanding and appreciation. Dr. Lucia GaskinsAhern aware of reschedule.

## 2017-04-21 NOTE — Telephone Encounter (Signed)
Pt's son called he has a conflict with work and cannot get off to bring the pt to the appt on 12/20. He is wanting to know if it could be moved up. Please call to discuss

## 2017-05-01 ENCOUNTER — Encounter (INDEPENDENT_AMBULATORY_CARE_PROVIDER_SITE_OTHER): Payer: Medicare Other | Admitting: Ophthalmology

## 2017-05-05 ENCOUNTER — Other Ambulatory Visit (INDEPENDENT_AMBULATORY_CARE_PROVIDER_SITE_OTHER): Payer: Self-pay

## 2017-05-05 DIAGNOSIS — Z0289 Encounter for other administrative examinations: Secondary | ICD-10-CM

## 2017-05-05 DIAGNOSIS — I608 Other nontraumatic subarachnoid hemorrhage: Secondary | ICD-10-CM

## 2017-05-05 DIAGNOSIS — Q283 Other malformations of cerebral vessels: Secondary | ICD-10-CM

## 2017-05-05 DIAGNOSIS — I619 Nontraumatic intracerebral hemorrhage, unspecified: Secondary | ICD-10-CM

## 2017-05-06 ENCOUNTER — Telehealth: Payer: Self-pay | Admitting: *Deleted

## 2017-05-06 LAB — BASIC METABOLIC PANEL
BUN/Creatinine Ratio: 29 — ABNORMAL HIGH (ref 12–28)
BUN: 19 mg/dL (ref 8–27)
CALCIUM: 10 mg/dL (ref 8.7–10.3)
CHLORIDE: 102 mmol/L (ref 96–106)
CO2: 27 mmol/L (ref 20–29)
Creatinine, Ser: 0.66 mg/dL (ref 0.57–1.00)
GFR calc Af Amer: 95 mL/min/{1.73_m2} (ref >59)
GFR, EST NON AFRICAN AMERICAN: 83 mL/min/{1.73_m2} (ref >59)
Glucose: 102 mg/dL — ABNORMAL HIGH (ref 65–99)
POTASSIUM: 4.3 mmol/L (ref 3.5–5.2)
Sodium: 144 mmol/L (ref 134–144)

## 2017-05-06 NOTE — Telephone Encounter (Signed)
Called pt's son Clementeen GrahamCurry ([place]) and informed him that Shawnda's labs showed mild dehydration. I advised him to encourage Bosie ClosJudith to increase her fluid intake. He verbalized understanding and appreciation for the call.

## 2017-05-06 NOTE — Telephone Encounter (Signed)
-----   Message from Anson FretAntonia B Ahern, MD sent at 05/06/2017 11:08 AM EST ----- Mild dehydration, have her  increase fluids otherwise unremarkable

## 2017-05-11 ENCOUNTER — Encounter: Payer: Self-pay | Admitting: Psychology

## 2017-05-11 ENCOUNTER — Ambulatory Visit (INDEPENDENT_AMBULATORY_CARE_PROVIDER_SITE_OTHER): Payer: Medicare Other | Admitting: Psychology

## 2017-05-11 DIAGNOSIS — R413 Other amnesia: Secondary | ICD-10-CM

## 2017-05-11 NOTE — Progress Notes (Signed)
   Neuropsychology Note  Lydia Carr came in today for 1 hour of neuropsychological testing with technician, Wallace Kellerana Chamberlain, BS, under the supervision of Dr. Elvis CoilMaryBeth Bailar. The patient did not appear overtly distressed by the testing session, per behavioral observation or via self-report to the technician. Rest breaks were offered. Lydia Carr will return within 2 weeks for a feedback session with Dr. Alinda DoomsBailar at which time her test performances, clinical impressions and treatment recommendations will be reviewed in detail. The patient understands she can contact our office should she require our assistance before this time.  Full report to follow.

## 2017-05-11 NOTE — Progress Notes (Signed)
NEUROPSYCHOLOGICAL INTERVIEW (CPT: [id number])  Name: Lydia Carr Date of Birth: Jul 20, 1932 Date of Interview: 05/11/2017  Reason for Referral:  Lydia KoJudith L Carr is a 83 y.o. female who is referred for neuropsychological evaluation by Dr. Naomie DeanAntonia Ahern of [facility] due to concerns about dementia. This patient is accompanied in the office by her son and daughter in law who supplement the history.  History of Presenting Problem:  [name] was seen for neurologic consultation by Dr. Lucia GaskinsAhern on 03/09/2017; MMSE was 20/30. Donepezil was started. MRI of the brain was completed on 04/11/2017 and reportedly revealed the following:  1.    Extensive T2/FLAIR hyperintense foci in the deep and subcortical white matter of both hemispheres with large confluencies in the periatrial white matter. This is consistent with chronic microvascular ischemic change. None of the foci appears to be acute.  2.    Hemosiderin deposition is noted along some of the left occipital and parietal sulci. This is consistent with focal superficial siderosis and could be idiopathic or due to cerebral amyloid angiopathy or recurrent bleeding from a vascular abnormality. 3.    Brain volume is normal for age. 4.   There are no acute findings. She will have a CTA of the head on 05/15/2017.  Memory changes reportedly started around 2 years ago and have progressively worsened over time. There is no known family history of dementia. She continues to smoke about 1 pack of cigarettes per day. Cognitive symptoms include forgetfulness for recent conversations/events, repetition of statements/questions, word finding difficulty, difficulty learning features of her new car, new spelling difficulty, and difficulty completing multi-step tasks or that require decision making. She also reports she is more distractible and gets frustrated more easily with interruptions. She denied any difficulty with auditory comprehension.    She lives independently in a townhouse. She has a very close friend 3 doors down, and her son and daughter in law have started visiting her weekly every Sunday (they live in [place]). She agreed to stop driving after her visit with Dr. Lucia GaskinsAhern, at least while she is undergoing workup of her memory difficulties. She had not gotten lost prior to this but she was limiting her driving to within a 2 mile radius. Her family assists with setting her appointments and then she writes them on her calendar and checks her calendar regularly. She manages her medications independently and denies any difficulty with this (she uses a daily pill planner). She manages her finances independently but her son is able to monitor them online. She notes she forgot to pay a bill on one occasion. She does not cook anymore because she has forgotten how to make recipes she used to make all the time. She did look at assisted living facilities and was considering one but ultimately decided she did not want to leave her home. Also, she did not want to quit smoking which was required by the facility.  Physically, she reports reduced energy. She takes a nap in the afternoon. She endorses reduced balance. She has not had any falls. She takes alprazolam for sleep and usually sleeps well but occasionally has early morning awakening. Appetite is significantly reduced over the last 2-3 years and she has lost weight.  She reports her mood is pretty good. She endorses recent onset of anxiety which she describes as having the "heeby-jeebies". Her son describes her as fretful, exhibiting frustration with her difficulty remembering things and with losing some independence. She is socializing less and is content  to stay home. She reports she does experience depressed mood around the holidays but can't pinpoint why. Prior psychiatric history was denied. She has never been treated by a mental health professional.   Social History: Born/Raised:  [place] Education: Engineer, maintenance (IT)College graduate - BA (Social research officer, governmentinter[employer] design) Occupational history: Homemaker Marital history: Widowed; previously married for approximately 45 years (1958-2003) Children: 2 sons, 4 grands Alcohol: None Tobacco: Smokes "not quite a pack per day"   Medical History: Past Medical History:  Diagnosis Date  . COPD (chronic obstructive pulmonary disease) (HCC)       Current Medications:  Outpatient Encounter Medications as of 05/11/2017  Medication Sig  . ALPRAZolam (XANAX) 0.25 MG tablet Take 0.25 mg by mouth at bedtime as needed.  . Cholecalciferol (VITAMIN D3 PO) Take 2,000 Int'l Units by mouth daily.  Marland Kitchen. donepezil (ARICEPT) 5 MG tablet Take 1 tablet (5 mg total) by mouth at bedtime.  . polyethylene glycol powder (GLYCOLAX/MIRALAX) powder Take 1 Container by mouth daily as needed.  . vitamin B-12 (CYANOCOBALAMIN) 1000 MCG tablet Take 1,000 mcg by mouth daily.  . vitamin C (ASCORBIC ACID) 500 MG tablet Take 500 mg by mouth daily.   No facility-administered encounter medications on file as of 05/11/2017.      Behavioral Observations:   Appearance: Neatly and appropriately dressed and groomed Gait: Ambulated independently, no gross abnormalities observed Speech: Fluent; normal rate, rhythm and volume. Mild to moderate word finding difficulty. Repeats herself occasionally. Thought process: Generally linear Affect: Full, anxious, tearful at one point, slightly irritable that she has so many doctor's appointments Interpersonal: Generally pleasant, appropriate   TESTING: There is medical necessity to proceed with neuropsychological assessment as the results will be used to aid in differential diagnosis and clinical decision-making and to inform specific treatment recommendations. Per the patient, her family and medical records reviewed, there has been a change in cognitive functioning and a reasonable suspicion of dementia (vascular dementia versus AD versus  mixed).  Following the clinical interview, the patient completed a full battery of neuropsychological testing with my psychometrician under my supervision.   PLAN: The patient will return to see me on 05/25/2017 for a follow-up session at which time her test performances and my impressions and treatment recommendations will be reviewed in detail.  Full report to follow.

## 2017-05-15 ENCOUNTER — Ambulatory Visit
Admission: RE | Admit: 2017-05-15 | Discharge: 2017-05-15 | Disposition: A | Discharge status: Home / Self Care | Payer: Medicare Other | Source: Ambulatory Visit | Attending: Neurology | Admitting: Neurology

## 2017-05-15 DIAGNOSIS — I608 Other nontraumatic subarachnoid hemorrhage: Secondary | ICD-10-CM

## 2017-05-15 DIAGNOSIS — I619 Nontraumatic intracerebral hemorrhage, unspecified: Secondary | ICD-10-CM

## 2017-05-15 MED ORDER — IOPAMIDOL (ISOVUE-370) INJECTION 76%
75.0000 mL | Freq: Once | INTRAVENOUS | Status: AC | PRN
  Administered 2017-05-15: 75 mL via INTRAVENOUS

## 2017-05-19 ENCOUNTER — Telehealth: Payer: Self-pay | Admitting: *Deleted

## 2017-05-19 NOTE — Telephone Encounter (Signed)
Called patient's son Clementeen GrahamCurry ([place] He had already read the report of the CT-A and is aware that it was negative for any cerebrovascular malformation or aneurysm. He had no questions.

## 2017-05-19 NOTE — Telephone Encounter (Signed)
-----   Message from Anson FretAntonia B Ahern, MD sent at 05/18/2017  5:12 PM EST ----- Negative for cerebrovascular malformation or aneurysm.

## 2017-05-24 NOTE — Progress Notes (Deleted)
NEUROPSYCHOLOGICAL EVALUATION   Name:    Lydia Carr Casa  Date of Birth:   07-31-1932 Date of Interview:  05/11/2017 Date of Testing:  05/11/2017   Date of Feedback:  05/26/2017       Background Information:  Reason for Referral:  Lydia Carr Voeltz is a 83 y.o. female referred by Dr. Naomie DeanAntonia Ahern to assess her current level of cognitive functioning and assist in differential diagnosis. The current evaluation consisted of a review of available medical records, an interview with the patient and her son and daughter in law, and the completion of a neuropsychological testing battery. Informed consent was obtained.  History of Presenting Problem:  [name] was seen for neurologic consultation by Dr. Lucia GaskinsAhern on 03/09/2017; MMSE was 20/30. Donepezil was started. MRI of the brain was completed on 04/11/2017 and reportedly revealed the following:  1. Extensive T2/FLAIR hyperintense foci in the deep and subcortical white matter of both hemispheres with large confluencies in the periatrial white matter. This is consistent with chronic microvascular ischemic change. None of the foci appears to be acute.  2. Hemosiderin deposition is noted along some of the left occipital and parietal sulci. This is consistent with focal superficial siderosis and could be idiopathic or due to cerebral amyloid angiopathy or recurrent bleeding from a vascular abnormality. 3. Brain volume is normal for age. 4. There are no acute findings. CTA of the head was performed on 05/15/2017 and was negative for any cerebrovascular malformation or aneurysm.  Memory changes reportedly started around 2 years ago and have progressively worsened over time. There is no known family history of dementia. She continues to smoke about 1 pack of cigarettes per day. Cognitive symptoms include forgetfulness for recent conversations/events, repetition of statements/questions, word finding difficulty, difficulty learning features  of her new car, new spelling difficulty, and difficulty completing multi-step tasks or that require decision making. She also reports she is more distractible and gets frustrated more easily with interruptions. She denied any difficulty with auditory comprehension.   She lives independently in a townhouse. She has a very close friend 3 doors down, and her son and daughter in law have started visiting her weekly every Sunday ([place]). She agreed to stop driving after her visit with Dr. Lucia GaskinsAhern, at least while she is undergoing workup of her memory difficulties. She had not gotten lost prior to this but she was limiting her driving to within a 2 mile radius. Her family assists with setting her appointments and then she writes them on her calendar and checks her calendar regularly. She manages her medications independently and denies any difficulty with this (she uses a daily pill planner). She manages her finances independently but her son is able to monitor them online. She notes she forgot to pay a bill on one occasion. She does not cook anymore because she has forgotten how to make recipes she used to make all the time. She did look at assisted living facilities and was considering one but ultimately decided she did not want to leave her home. Also, she did not want to quit smoking which was required by the facility.  Physically, she reports reduced energy. She takes a nap in the afternoon. She endorses reduced balance. She has not had any falls. She takes alprazolam for sleep and usually sleeps well but occasionally has early morning awakening. Appetite is significantly reduced over the last 2-3 years and she has lost weight.  She reports her mood is pretty  good. She endorses recent onset of anxiety which she describes as having the "heeby-jeebies". Her son describes her as fretful, exhibiting frustration with her difficulty remembering things and with losing some independence. She is  socializing less and is content to stay home. She reports she does experience depressed mood around the holidays but can't pinpoint why. Prior psychiatric history was denied. She has never been treated by a mental health professional.   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 COPD (chronic obstructive pulmonary disease) (HCC)     Current medications:  Outpatient Encounter Medications as of 05/26/2017  Medication Sig  . ALPRAZolam (XANAX) 0.25 MG tablet Take 0.25 mg by mouth at bedtime as needed.  . Cholecalciferol (VITAMIN D3 PO) Take 2,000 Int'Carr Units by mouth daily.  Marland Kitchen donepezil (ARICEPT) 5 MG tablet Take 1 tablet (5 mg total) by mouth at bedtime.  . polyethylene glycol powder (GLYCOLAX/MIRALAX) powder Take 1 Container by mouth daily as needed.  . vitamin B-12 (CYANOCOBALAMIN) 1000 MCG tablet Take 1,000 mcg by mouth daily.  . vitamin C (ASCORBIC ACID) 500 MG tablet Take 500 mg by mouth daily.   No facility-administered encounter medications on file as of 05/26/2017.      Current Examination:  Behavioral Observations:  Appearance: Neatly and appropriately dressed and groomed Gait: Ambulated independently, no gross abnormalities observed Speech: Fluent; normal rate, rhythm and volume. Mild to moderate word finding difficulty. Repeats herself occasionally. Thought process: Generally linear Affect: Full, anxious, tearful at one point, slightly irritable that she has so many doctor's appointments Interpersonal: Generally pleasant, appropriate Orientation: Oriented to all spheres. Accurately named the current President and his predecessor. ***  Tests Administered: . Test of  Premorbid Functioning (TOPF) . Wechsler Adult Intelligence Scale-Fourth Edition (WAIS-IV): Similarities, Clinical cytogeneticist, Coding and Digit Span subtests . Wechsler Memory Scale-Fourth Edition (WMS-IV) Older Adult Version (ages 21-90): Logical Memory I, II and Recognition subtests  . DIRECTV Verbal Learning Test - 2nd Edition (CVLT-2) Short Form . Repeatable Battery for the Assessment of Neuropsychological Status (RBANS) Form A:  Figure Copy and Recall subtests and Semantic Fluency subtest . Boston Naming Test (BNT) . Boston Diagnostic Aphasia Examination: Complex Ideational Material subtest . Controlled Oral Word Association Test (COWAT) . Trail Making Test A and B . Clock drawing test . Geriatric Depression Scale (GDS) 15 Item . Generalized Anxiety Disorder - 7 item screener (GAD-7)  Test Results: Note: Standardized scores are presented only for use by appropriately trained professionals and to allow for any future test-retest comparison. These scores should not be interpreted without consideration of all the information that is contained in the rest of the report. The most recent standardization samples from the test publisher or other sources were used whenever possible to derive standard scores; scores were corrected for age, gender, ethnicity and education when available.   Test Scores:  ***  Description of Test Results:  Premorbid verbal intellectual abilities were estimated to have been within the *** range based on a test of word reading. Psychomotor processing speed was ***. Auditory attention and working memory were ***. Visual-spatial construction was ***. Language abilities were ***. Specifically, confrontation naming was ***, and semantic verbal fluency was ***. Auditory comprehension of complex ideational material was ***. With  regard to verbal memory, encoding and acquisition of non-contextual information (i.e., word list) was ***. After a brief distracter task, free recall was  ***. After a delay, free recall was ***. Cued recall was ***. Performance on a yes/no recognition task was ***. On another verbal memory test, encoding and acquisition of contextual auditory information (i.e., short stories) was ***. After a delay, free recall was ***. Performance on a yes/no recognition task was ***. With regard to non-verbal memory, delayed free recall of visual information was ***. Executive functioning was *** overall. Mental flexibility and set-shifting were *** on Trails B. Verbal fluency with phonemic search restrictions was ***. Verbal abstract reasoning was ***. Non-verbal abstract reasoning was ***. Performance on a clock drawing task was ***. On a self-report measure of mood, the patient's responses {were/were not:19694} indicative of clinically significant depression at the present time. Symptoms endorsed included: ***. On a self-report measure of anxiety, the patient did not endorse*** clinically significant generalized anxiety at the present time.    Clinical Impressions: Mild dementia most likely due to Alzheimer's disease.    Recommendations/Plan: Based on the findings of the present evaluation, the following recommendations are offered:  1. [facility], stop driving, continue assistance with meds/appts/finances.   Feedback to Patient: Lydia Carr Marzano returned for a feedback appointment on 05/26/2017 to review the results of her neuropsychological evaluation with this provider. *** minutes face-to-face time was spent reviewing her test results, my impressions and my recommendations as detailed above.    Total time spent on this patient's case: 90791x1 unit for interview with psychologist; [phone] units of testing by psychometrician under psychologist's supervision; [phone] units for medical record review, scoring of neuropsychological tests, interpretation of test results, preparation of this report, and review of results to the patient by  psychologist.      Thank you for your referral of Lydia Carr Buttermore. Please feel free to contact me if you have any questions or concerns regarding this report.

## 2017-05-25 ENCOUNTER — Encounter: Payer: Medicare Other | Admitting: Psychology

## 2017-05-26 ENCOUNTER — Encounter: Payer: Medicare Other | Admitting: Psychology

## 2017-05-28 ENCOUNTER — Telehealth: Payer: Self-pay | Admitting: Neurology

## 2017-05-28 NOTE — Telephone Encounter (Signed)
Called Clementeen GrahamCurry, pt's son, and LVM asking for call back at his convenience.

## 2017-05-28 NOTE — Telephone Encounter (Signed)
Pt son(on DPR) calling asking that RN Toma CopierBethany calls him re: test pt was supposed to have done but has been delayed due to weather.  Please call

## 2017-05-28 NOTE — Progress Notes (Deleted)
  [facility]    Provider:  Dr Lucia GaskinsAhern Referring Provider: Mosetta PuttBlomgren, Peter, MD Primary Care Physician:  Mosetta PuttBlomgren, Peter, MD  CC:  ***  HPI:  Lydia Carr is a 83 y.o. female here as a referral from Dr. Duaine DredgeBlomgren for ***.  ***  Reviewed notes, labs and imaging from outside physicians, which showed ***  Review of Systems: Patient complains of symptoms per HPI as well as the following symptoms ***. Pertinent negatives and positives per HPI. All others negative.   Social History   Socioeconomic History  . Marital status: Single    Spouse name: Not on file  . Number of children: Not on file  . Years of education: Not on file  . Highest education level: Not on file  Social Needs  . Financial resource strain: Not on file  . Food insecurity - worry: Not on file  . Food insecurity - inability: Not on file  . Transportation needs - medical: Not on file  . Transportation needs - non-medical: Not on file  Occupational History  . Not on file  Tobacco Use  . Smoking status: Current Every Day Smoker    Packs/day: 1.00  . Smokeless tobacco: Never Used  Substance and Sexual Activity  . Alcohol use: No  . Drug use: No  . Sexual activity: Not on file  Other Topics Concern  . Not on file  Social History Narrative  . Not on file    Family History  Problem Relation Age of Onset  . Dementia Neg Hx     Past Medical History:  Diagnosis Date  . COPD (chronic obstructive pulmonary disease) (HCC)     Past Surgical History:  Procedure Laterality Date  . NO PAST SURGERIES      Current Outpatient Medications  Medication Sig Dispense Refill  . ALPRAZolam (XANAX) 0.25 MG tablet Take 0.25 mg by mouth at bedtime as needed.    . Cholecalciferol (VITAMIN D3 PO) Take 2,000 Int'l Units by mouth daily.    Marland Kitchen. donepezil (ARICEPT) 5 MG tablet Take 1 tablet (5 mg total) by mouth at bedtime. 30 tablet 11  . polyethylene glycol powder (GLYCOLAX/MIRALAX) powder Take 1  Container by mouth daily as needed.    . vitamin B-12 (CYANOCOBALAMIN) 1000 MCG tablet Take 1,000 mcg by mouth daily.    . vitamin C (ASCORBIC ACID) 500 MG tablet Take 500 mg by mouth daily.     No current facility-administered medications for this visit.     Allergies as of 05/29/2017 - Review Complete 05/11/2017  Allergen Reaction Noted  . Penicillins Swelling 03/09/2017    Vitals: There were no vitals taken for this visit. Last Weight:  Wt Readings from Last 1 Encounters:  03/09/17 96 lb 12.8 oz (43.9 kg)   Last Height:   Ht Readings from Last 1 Encounters:  03/09/17 5' 1.5" (1.562 m)         Assessment/Plan:    @[email or web address] @  Naomie DeanAntonia Ahern, MD  [facility] [address] [place], [place] [id number]  Phone [phone] Fax [phone]  A total of *** minutes was spent face-to-face with this patient. Over half this time was spent on counseling patient on the *** diagnosis and different diagnostic and therapeutic options available.

## 2017-05-29 ENCOUNTER — Ambulatory Visit: Payer: Self-pay | Admitting: Neurology

## 2017-06-02 ENCOUNTER — Encounter: Payer: Self-pay | Admitting: Psychology

## 2017-06-02 ENCOUNTER — Ambulatory Visit (INDEPENDENT_AMBULATORY_CARE_PROVIDER_SITE_OTHER): Payer: Medicare Other | Admitting: Psychology

## 2017-06-02 DIAGNOSIS — G301 Alzheimer's disease with late onset: Secondary | ICD-10-CM

## 2017-06-02 DIAGNOSIS — F0280 Dementia in other diseases classified elsewhere without behavioral disturbance: Secondary | ICD-10-CM

## 2017-06-02 NOTE — Telephone Encounter (Signed)
Pt's son returned RN's call °

## 2017-06-02 NOTE — Telephone Encounter (Signed)
Returned Curry's call. He stated that the patient saw Dr. Daneen SchickBailer today and now he wanted to schedule a f/u with Dr. Lucia GaskinsAhern. I informed him that a f/u was already scheduled for 07/13/17 @ 1:00. He verbalized understanding and appreciation and had no further concerns.

## 2017-06-02 NOTE — Progress Notes (Signed)
NEUROPSYCHOLOGICAL EVALUATION   Name:    Lydia Carr  Date of Birth:   07-22-1932 Date of Interview:  05/11/2017 Date of Testing:  05/11/2017   Date of Feedback:  06/02/2017       Background Information:  Reason for Referral:  Lydia Carr is a 83 y.o. female referred by Dr. Sarina Ill to assess her current level of cognitive functioning and assist in differential diagnosis. The current evaluation consisted of a review of available medical records, an interview with the patient and her son and daughter in law, and the completion of a neuropsychological testing battery. Informed consent was obtained.  History of Presenting Problem:  Lydia Carr was seen for neurologic consultation by Dr. Jaynee Eagles on 03/09/2017; MMSE was 20/30. Donepezil was started. MRI of the brain was completed on 04/11/2017 and reportedly revealed the following:  1.    Extensive T2/FLAIR hyperintense foci in the deep and subcortical white matter of both hemispheres with large confluencies in the periatrial white matter. This is consistent with chronic microvascular ischemic change. None of the foci appears to be acute.  2.    Hemosiderin deposition is noted along some of the left occipital and parietal sulci. This is consistent with focal superficial siderosis and could be idiopathic or due to cerebral amyloid angiopathy or recurrent bleeding from a vascular abnormality. 3.    Brain volume is normal for age. 4.   There are no acute findings. CTA of the head was performed on 05/15/2017 and was negative for any cerebrovascular malformation or aneurysm.   Memory changes reportedly started around 2 years ago and have progressively worsened over time. There is no known family history of dementia. She continues to smoke about 1 pack of cigarettes per day. Cognitive symptoms include forgetfulness for recent conversations/events, repetition of statements/questions, word finding difficulty, difficulty learning features  of her new car, new spelling difficulty, and difficulty completing multi-step tasks or that require decision making. She also reports she is more distractible and gets frustrated more easily with interruptions. She denied any difficulty with auditory comprehension.    She lives independently in a townhouse. She has a very close friend 3 doors down, and her son and daughter in law have started visiting her weekly every Sunday (they live in [place]). She agreed to stop driving after her visit with Dr. Jaynee Eagles, at least while she is undergoing workup of her memory difficulties. She had not gotten lost prior to this but she was limiting her driving to within a 2 mile radius. Her family assists with setting her appointments and then she writes them on her calendar and checks her calendar regularly. She manages her medications independently and denies any difficulty with this (she uses a daily pill planner). She manages her finances independently but her son is able to monitor them online. She notes she forgot to pay a bill on one occasion. She does not cook anymore because she has forgotten how to make recipes she used to make all the time. She did look at assisted living facilities and was considering one but ultimately decided she did not want to leave her home. Also, she did not want to quit smoking which was required by the facility.   Physically, she reports reduced energy. She takes a nap in the afternoon. She endorses reduced balance. She has not had any falls. She takes alprazolam for sleep and usually sleeps well but occasionally has early morning awakening. Appetite is significantly reduced over the last  2-3 years and she has lost weight.   She reports her mood is pretty good. She endorses recent onset of anxiety which she describes as having the "heeby-jeebies". Her son describes her as fretful, exhibiting frustration with her difficulty remembering things and with losing some independence. She is  socializing less and is content to stay home. She reports she does experience depressed mood around the holidays but can't pinpoint why. Prior psychiatric history was denied. She has never been treated by a mental health professional.     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 COPD (chronic obstructive pulmonary disease) ([facility])     Current medications:  Outpatient Encounter Medications as of 06/02/2017  Medication Sig  . ALPRAZolam (XANAX) 0.25 MG tablet Take 0.25 mg by mouth at bedtime as needed.  . Cholecalciferol (VITAMIN D3 PO) Take 2,000 Int'l Units by mouth daily.  Marland Kitchen donepezil (ARICEPT) 5 MG tablet Take 1 tablet (5 mg total) by mouth at bedtime.  . polyethylene glycol powder (GLYCOLAX/MIRALAX) powder Take 1 Container by mouth daily as needed.  . vitamin B-12 (CYANOCOBALAMIN) 1000 MCG tablet Take 1,000 mcg by mouth daily.  . vitamin C (ASCORBIC ACID) 500 MG tablet Take 500 mg by mouth daily.   No facility-administered encounter medications on file as of 06/02/2017.      Current Examination:  Behavioral Observations:  Appearance: Neatly and appropriately dressed and groomed Gait: Ambulated independently, no gross abnormalities observed Speech: Fluent; normal rate, rhythm and volume. Mild to moderate word finding difficulty. Repeats herself occasionally. Thought process: Generally linear Affect: Full, anxious, tearful at one point, slightly irritable that she has so many doctor's appointments Interpersonal: Generally pleasant, appropriate Orientation: Oriented to person, current year and current city. Not oriented to current age (one year off), or current month/date/day.  Accurately named the current President but could not name his immediate predecessor.   Tests Administered: . Test of Premorbid Functioning (TOPF) . Wechsler Adult Intelligence Scale-Fourth Edition (WAIS-IV): Similarities, Music therapist, Coding and Digit Span subtests . Wechsler Memory Scale-Fourth Edition (WMS-IV) Older Adult Version (ages 10-90): Logical Memory I, II and Recognition subtests  . [employer] Verbal Learning Test - 2nd Edition (CVLT-2) Short Form . Repeatable Battery for the Assessment of Neuropsychological Status (RBANS) Form A:  Figure Copy and Recall subtests and Semantic Fluency subtest . Boston Naming Test (BNT) . Boston Diagnostic Aphasia Examination: Complex Ideational Material subtest . Controlled Oral Word Association Test (COWAT) . Trail Making Test A and B . Clock drawing test . Geriatric Depression Scale (GDS) 15 Item . Generalized Anxiety Disorder - 7 item screener (GAD-7)  Test Results: Note: Standardized scores are presented only for use by appropriately trained professionals and to allow for any future test-retest comparison. These scores should not be interpreted without consideration of all the information that is contained in the rest of the report. The most recent standardization samples from the test publisher or other sources were used whenever possible to derive standard scores; scores were corrected for age, gender, ethnicity and education when available.   Test Scores:  Test Name Raw Score Standardized Score Descriptor  TOPF 22/70 SS= 86 Low average  WAIS-IV Subtests     Similarities 14/36 ss= 7 Low average  Block Design 32/66  ss= 12 High average  Coding 31/135 ss= 8 Average  Digit Span Forward 8/16 ss= 8 Average  Digit Span Backward 7/16 ss= 10 Average  WMS-IV Subtests     LM I 11/53 ss= 4 Impaired  LM II 0/39 ss= 2 Impaired  LM II Recognition 10/23 Cum %: <2 Impaired  RBANS Subtests     Figure Copy 17/20 Z= -0.2 Average  Figure Recall 2/20  Z= -2.3 Impaired  Semantic Fluency 9/40 Z= -2.3 Impaired  CVLT-II Scores     Trial 1 3/9 Z= -2.5 Impaired  Trial 4 4/9 Z= -2 Impaired  Trials 1-4 total 15/36 T= 27 Impaired  SD Free Recall 4/9 Z= -1.5 Borderline  LD Free Recall 0/9 Z= -2.5 Impaired  LD Cued Recall 0/9 Z= -3.5 Severely impaired  Recognition Hits 6/9  Z= -3 Severely impaired  Recognition False Positives 2 Z= -0.5 Average  Forced Choice Recognition 8/9  Impaired  BNT 31/60 T= 22 Severely impaired  BDAE Subtest     Complex Ideational Material 12/12  WNL  COWAT-FAS 21 T= 36 Borderline  COWAT-Animals 9 T= 33 Borderline   Trail Making Test A  56" 0 errors T= 52 Average  Trail Making Test B  Pt unable   Impaired  Clock Drawing   Impaired  GDS-15 6/15  Mild  GAD-7 3/21  WNL      Description of Test Results:  Premorbid verbal intellectual abilities were estimated to have been within the low average range based on a test of word reading. Psychomotor processing speed was average. Auditory attention and working memory were average. Visual-spatial construction was average to high average. Semantic retrieval abilities were impaired. Specifically, confrontation naming was severely impaired, and semantic verbal fluency was impaired. Auditory comprehension of complex ideational material was intact. With regard to verbal memory, encoding and acquisition of non-contextual information (i.e., word list) was impaired. After a brief distracter task, free recall was borderline impaired (4/9 items recalled). After a delay, free recall was impaired (0/9 items recalled). Cued recall was severely impaired (0/9 items recalled). Performance on a yes/no recognition task was severely impaired due to low recognition of target items. On another verbal memory test, encoding and acquisition of contextual auditory information (i.e., short stories) was impaired. After a delay, free recall was severely impaired (no aspects of the original stories was  recalled). Performance on a yes/no recognition task was severely impaired. With regard to non-verbal memory, delayed free recall of visual information was impaired. Executive functioning was somewhat variable but largely fell below expectation. Mental flexibility and set-shifting were impaired; she was unable to complete Trails B. Verbal fluency with phonemic search restrictions was borderline impaired. Verbal abstract reasoning was low average. Performance on a clock drawing task was impaired. On a self-report measure of mood, the patient's responses were indicative of mild depression at the present time. Symptoms endorsed included: dropping interests/activities, feeling helpless, preferring to stay home, reduced energy and feelings of hopelessness. On a self-report measure of anxiety, the patient did not endorse clinically significant generalized anxiety at the present time.    Clinical Impressions: Mild dementia most likely due to Alzheimer's disease. Results of objective testing revealed significant cognitive impairments in several domains, including memory encoding and consolidation, semantic retrieval and executive functioning. Additionally, there is evidence that her cognitive deficits are interfering with her ability to manage complex tasks such as managing finances and remembering to pay bills. As such, diagnostic criteria for a dementia syndrome are met.  The patient's cognitive  profile is highly suggestive of medial temporal lobe involvement. Based on her neurocognitive profile and clinical features, the most likely etiology is Alzheimer's disease. She does have vascular risk factors with ongoing daily tobacco use, and evidence of small vessel disease on brain MRI, but her testing profile is more in line with Alzheimer's disease than vascular dementia.  I would characterize her dementia as mild stage at the present time.   Recommendations/Plan: Based on the findings of the present evaluation,  the following recommendations are offered:  1. The patient appears to be an appropriate candidate for cholinesterase inhibitor therapy. She has started taking Aricept. 2. I highly recommend that the patient consider moving to assisted living within a continuing care retirement community. I think she will not only have her needs better met there but will also have more opportunities for socialization, mental stimulation and enhanced quality of life.  3. For her own safety and the safety of others, it is my recommendation that she not return to driving. I make this recommendation based on her performances on neurocognitive testing results including an impaired performance on a cognitive test highly correlated with driving ability. Being in an assisted living/[facility] environment would assist her in being able to get transportation to places regularly now that she is not driving.  4. Due to her significant memory impairment, she will need ongoing assistance with all complex ADLs including medication management (should be administered to her daily), appointments/scheduling, and finances (bill paying).  5. Her family will benefit from education and support regarding the diagnosis. They are referred to the Alzheimer's Association ([employer]) which has tremendous Scientist, clinical (histocompatibility and immunogenetics) and information on local resources.    Feedback to Patient: Lydia Carr and her son returned for a feedback appointment on 06/02/2017 to review the results of her neuropsychological evaluation with this provider. 35 minutes face-to-face time was spent reviewing her test results, my impressions and my recommendations as detailed above.   Total time spent on this patient's case: [id number] unit for interview with psychologist; [phone] units of testing by psychometrician under psychologist's supervision; [phone] units for medical record review, scoring of neuropsychological tests, interpretation of test results, preparation of this  report, and review of results to the patient by psychologist.    A copy of this report will be sent to the patient's son, per their request.    Thank you for your referral of Lydia Carr. Please feel free to contact me if you have any questions or concerns regarding this report.

## 2017-06-04 ENCOUNTER — Ambulatory Visit: Payer: Self-pay | Admitting: Neurology

## 2017-06-22 ENCOUNTER — Encounter (INDEPENDENT_AMBULATORY_CARE_PROVIDER_SITE_OTHER): Payer: Medicare Other | Admitting: Ophthalmology

## 2017-07-13 ENCOUNTER — Ambulatory Visit (INDEPENDENT_AMBULATORY_CARE_PROVIDER_SITE_OTHER): Payer: Medicare Other | Admitting: Neurology

## 2017-07-13 ENCOUNTER — Encounter: Payer: Medicare Other | Admitting: Psychology

## 2017-07-13 ENCOUNTER — Encounter: Payer: Self-pay | Admitting: Neurology

## 2017-07-13 VITALS — BP 117/67 | HR 90 | Ht 62.0 in | Wt 93.0 lb

## 2017-07-13 DIAGNOSIS — F0280 Dementia in other diseases classified elsewhere without behavioral disturbance: Secondary | ICD-10-CM | POA: Diagnosis not present

## 2017-07-13 DIAGNOSIS — G301 Alzheimer's disease with late onset: Secondary | ICD-10-CM

## 2017-07-13 MED ORDER — DONEPEZIL HCL 10 MG PO TABS: 10.0000 mg | ORAL_TABLET | Freq: Every day | ORAL | 4 refills | Status: DC

## 2017-07-13 NOTE — Addendum Note (Signed)
Addended by: Naomie DeanAHERN, ANTONIA B on: 07/13/2017 01:49 PM   Modules accepted: Orders

## 2017-07-13 NOTE — Progress Notes (Signed)
[facility]    Provider:  Dr Lucia GaskinsAhern Referring Provider: Mosetta PuttBlomgren, Peter, MD Primary Care Physician:  Mosetta PuttBlomgren, Peter, MD  CC:  Dementia follow up  Interval history July 13, 2017: Patient returns today for follow-up for dementia.  Patient has completed formal neurocognitive testing that showed mild dementia most likely due to Alzheimer's disease.  She does have ongoing vascular risk factors with daily tobacco use and evidence of small vessel disease on brain MRI but her testing profile is more consistent with Alzheimer's dementia rather than vascular dementia.  She is in the mild stage at the present time.  She is on Aricept at this time.  Discussed moving to assisted living, which was also recommended by Dr. Alinda DoomsBailar as well for more opportunities for socialization, mental stimulation and enhance quality of life.  I also recommend she discontinue driving, also suggested by Dr. Alinda DoomsBailar.  She needs assistance with all complex ADLs, and medication management.  Provided information on Alzheimer's.org and other support groups, programs in the area. Had a long discussion with patient, daughter and son. She feels the Aricept has help.   HPI:  Lydia Carr is a 84 y.o. female here as a referral from Dr. Duaine DredgeBlomgren for dementia.PMHx COPD. Here with son and daughter in law. Patient feels like she is losing her memory. Started about 2 years ago and progressive. Patient says she started with having problems with dates, appointments, she had to write everything down. She can't remember how to cook things. She can't remember ingredients and has difficulty with executive function. No accidents. She gets lost if she goes too far but only drives 2 miles from her home. She is having more confusion. She repeats things. She lives independently in a town home, son helps her with finances. She forgets names. Repeats things in the same phone conversation. Denies any FHx of dementia, dad lived until 100  and possibly had memory issues unclear when it started. Mother was 886 when she died no know FHx. No alcohol use, no drug use or previous history of addiction. She is a current smoker. 1PPD, 65 pack years. More short term memory loss reported. Son and daughter-in-law provide much information. No other focal neurologic deficits, associated symptoms, inciting events or modifiable factors.   Reviewed notes, labs and imaging from outside physicians, which showed:  Reviewed pcp notes: MMSE 21/30 in June of this year. Patient's symptoms have been developing over the last 2 years particularly since of apathy the hand and. She also has anxiety over the last few months and is on alprazolam. She has a number of other chronic problems COPD, impaired tolerance constipation, osteopenia, hypercholesterolemia and degenerative disc disease of the L5 spine.  Personally reviewed imaging and agree with the following CT of the head May 2007:  1. No acute findings.  2. Patchy low attenuation in the periventricular white matter is likely due to chronic microvascular ischemic changes. MR, as clinically indicated.  Review of Systems: Patient complains of symptoms per HPI as well as the following symptoms: Weight loss, cough, memory loss, confusion, anxiety, change Pertinent negatives and positives per HPI. All others negative.   Social History   Socioeconomic History  . Marital status: Single    Spouse name: Not on file  . Number of children: 2  . Years of education: Not on file  . Highest education level: Bachelor's degree (e.g., BA, AB, BS)  Social Needs  . Financial resource strain: Not on file  . Food insecurity - worry:  Not on file  . Food insecurity - inability: Not on file  . Transportation needs - medical: Not on file  . Transportation needs - non-medical: Not on file  Occupational History  . Not on file  Tobacco Use  . Smoking status: Current Every Day Smoker    Packs/day: 0.75    Types: Cigarettes   . Smokeless tobacco: Never Used  Substance and Sexual Activity  . Alcohol use: No  . Drug use: No  . Sexual activity: Not on file  Other Topics Concern  . Not on file  Social History Narrative   Lives at home alone   Right handed   Drinks caffeine daily (tea, occasional coffee)    Family History  Problem Relation Age of Onset  . Macular degeneration Brother   . Dementia Neg Hx     Past Medical History:  Diagnosis Date  . COPD (chronic obstructive pulmonary disease) ([facility])   . Macular degeneration     Past Surgical History:  Procedure Laterality Date  . NO PAST SURGERIES      Current Outpatient Medications  Medication Sig Dispense Refill  . ALPRAZolam (XANAX) 0.25 MG tablet Take 0.25 mg by mouth at bedtime as needed.    . Ascorbic Acid (VITAMIN C PO) Take by mouth.    . Cholecalciferol (VITAMIN D PO) Take by mouth.    . donepezil (ARICEPT) 5 MG tablet Take 1 tablet (5 mg total) by mouth at bedtime. 30 tablet 11  . Multiple Vitamins-Minerals (EYE VITAMINS PO) Take by mouth.    . polyethylene glycol powder (GLYCOLAX/MIRALAX) powder Take 1 Container by mouth daily as needed.    . vitamin B-12 (CYANOCOBALAMIN) 1000 MCG tablet Take 1,000 mcg by mouth daily.     No current facility-administered medications for this visit.     Allergies as of 07/13/2017 - Review Complete 07/13/2017  Allergen Reaction Noted  . Other  07/13/2017  . Penicillins Swelling 03/09/2017    Vitals: BP 117/67 (BP Location: Right Arm, Patient Position: Sitting)   Pulse 90   Ht 5\' 2"  (1.575 m)   Wt 93 lb (42.2 kg)   BMI 17.01 kg/m  Last Weight:  Wt Readings from Last 1 Encounters:  07/13/17 93 lb (42.2 kg)   Last Height:   Ht Readings from Last 1 Encounters:  07/13/17 5\' 2"  (1.575 m)   Physical exam: Exam: Gen: NAD, pleasant           CV: RRR, no MRG. No Carotid Bruits. No peripheral edema, warm, nontender Eyes: Conjunctivae clear without exudates or hemorrhage  Neuro: Detailed  Neurologic Exam  Speech:    Speech is normal; fluent and spontaneous with normal comprehension.  Cognition:  MMSE - Mini Mental State Exam 03/09/2017  Orientation to time 3  Orientation to Place 4  Registration 3  Attention/ Calculation 1  Recall 0  Language- name 2 objects 2  Language- repeat 1  Language- follow 3 step command 3  Language- read & follow direction 1  Write a sentence 1  Copy design 1  Total score 20   Cranial Nerves:    The pupils are equal, round, and reactive to light. Attempted funduscopic exam could not visualize due to small pupils. Visual fields are full to finger confrontation. Extraocular movements are intact. Trigeminal sensation is intact and the muscles of mastication are normal. The face is symmetric. The palate elevates in the midline. Hearing intact. Voice is normal. Shoulder shrug is normal. The tongue has normal motion  without fasciculations.   Coordination:    No dysmetria  Gait:    Not ataxic, normal native gait  Motor Observation:    No asymmetry, no atrophy, and no involuntary movements noted. Tone:    Normal muscle tone.    Posture:    Posture is normal. normal erect    Strength:    Strength is V/V in the upper and lower limbs.      Sensation: intact to LT     Reflex Exam:  DTR's:    Deep tendon reflexes in the upper and lower extremities are symmetrical bilaterally.   Toes:    The toes are equivocal and bilaterally.   Clonus:    Clonus is absent.    Assessment/Plan:  This is an 84 year old female with progressive memory loss more short-term memory and executive function difficulties. Exam is nonfocal.   -Formal neurocognitive testing consistent with mild Alzheimer's disease -Recommend assisted living facility, highly recommend no driving -Continue Aricept at this time may consider Namenda in the future -She is currently on B12 replacement and follows with primary care to monitor B12 and thyroid -MRI of the brain did show  extensive T2 FLAIR microvascular changes however neurocognitive testing consistent with Alzheimer's and not vascular dementia.  We do recommend cessation of smoking and management of vascular risk factors due to these changes in the brain and daily aspirin.  May also have cerebral amyloid angiopathy.  CTA of the head was unremarkable, chronic siderosis in the left occipital lobe may be related to prior trauma or other causes.  No vascular malformation was identified. - Discussed [facility] but she is not interested in Clinical trials since family lives in [place] - Her friend transports her and family comes every weekend. - They looked at an assisted living facility but smoking is not an option there, Friends home  F/u with Butch Penny 6 months     Naomie Dean, MD  [facility] [address] [place], [place] [address]  Phone [phone] Fax [phone]  A total of 15 minutes was spent in with this patient. Over half this time was spent on counseling patient on the dementia diagnosis and different therapeutic options available.

## 2017-07-13 NOTE — Patient Instructions (Addendum)
For adult day center [facility] [address] [phone]  The 36-hour day - wonderful book on being a caretaker of someone with dementia  [email or web address]  [facility] [address]  [place], [place] [address] 10:00 AM Ladene Artist, 2nd Thursday of each month Please RSVP with Lydia Carr, [facility] Caregiver Support Coordinator [phone] or [email or web address] Last updated: February 2017    Alzheimer Disease Alzheimer disease is a brain disease that affects memory, thinking, and behavior. People with Alzheimer disease lose mental abilities, and the disease gets worse over time. Survival with Alzheimer disease ranges from several years to as long as 20 years. What are the causes? This condition develops when a protein called beta-amyloid forms deposits in the brain. It is not known what causes these deposits to form. What increases the risk? This condition is more likely to develop in people who:  Are elderly.  Have a family history of dementia.  Have had a brain injury.  Have heart or blood vessel disease.  Have had a stroke.  Have high blood pressure or high cholesterol.  Have diabetes.  What are the signs or symptoms? Symptoms of this condition happen in three stages, which often overlap. Early stage In this stage, you may continue to be independent. You may still be able to drive, work, and be social. Symptoms in this stage include:  Minor memory problems, such as forgetting a name or what you read.  Difficulty with: ? Paying attention. ? Communicating. ? Doing familiar tasks. ? Learning new things.  Needing more time to do daily activities.  Anxiety.  Social withdrawal.  Loss of motivation.  Moderate stage In this stage, you will start to need care. This stage usually lasts the longest. Symptoms in this stage include:  Difficulty with expressing thoughts.  Memory loss that affects daily  life. This can include forgetting: ? Your address or phone number. ? Events that have happened. ? Parts of your personal history, like where you went to school.  Confusion about where you are or what time it is.  Difficulty in judging distance.  Changes in personality, mood, and behavior. You may be moody, irritable, angry, frustrated, fearful, anxious, or suspicious.  Poor reasoning and judgment.  Delusions or hallucinations.  Changes in sleep patterns.  Wandering and getting lost.  Severe stage In the final stage, you will need help with your personal care and dailyactivities. Symptoms in this stage include:  Worsening memory loss.  Personality changes.  Loss of awareness of your surroundings.  Changes in physical abilities, including the ability to walk, sit, and swallow.  Difficulty in communicating.  Inability to control the bladder and bowels.  Increasing confusion.  Increasing disruptive behavior.  How is this diagnosed? This condition is diagnosed with an assessment by your health care provider. During this assessment, your health care provider will talk with you and your family, friends, or caregivers about your symptoms. A thorough medical history will be taken, and you will have a physical exam and tests. Tests may include:  Lab tests, such as blood or urine tests.  Imaging tests, such as a CT scan, PET scan, or MRI.  A lumbar puncture. This test involves removing and testing a small amount of the fluid that surrounds the brain and spinal cord.  An electroencephalogram (EEG). In this test, small metal discs are used to measure electrical activity in the brain.  Memory tests, cognitive tests, and neuropsychological tests. These tests evaluate brain function.  How is this treated? At this time, there is no treatment to cure Alzheimer disease or stop it from getting worse. The goals of treatment are:  To slow down the disease.  To manage behavioral  problems.  To provide you with a safe environment.  To make life easier for you and your caregivers.  The following treatment options are available:  Medicines. Medicines may help to slow down memory loss and control behavioral symptoms.  Talk therapy. Talk therapy provides you with education, support, and memory aids. It is most helpful in the early stages of the condition.  Counseling or spiritual guidance. It is normal to have a lot of feelings, including anger, relief, fear, and isolation. Counseling and guidance can help you deal with these feelings.  Caregiving. This involves having caregivers help you with your daily activities. Caregivers may be family members, friends, or trained medical professionals. Caregiving can be done at home or outside the home.  Family support groups. These provide education, emotional support, and information about community resources to family members who are taking care of you.  Follow these instructions at home: Medicines  Take over-the-counter and prescription medicines only as told by your health care provider.  Avoid taking medicines that can affect thinking, such as pain or sleeping medicines. Lifestyle   Make healthy lifestyle choices: ? Be physically active as told by your health care provider. ? Do not use any tobacco products, such as cigarettes, chewing tobacco, and e-cigarettes. If you need help quitting, ask your health care provider. ? Eat a healthy diet. ? Practice stress-management techniques when you get stressed. ? Stay social.  Drink enough fluid to keep your urine clear or pale yellow.  Make sure to get quality sleep. These tips can help you get a good night's rest: ? Avoid napping during the day. ? Keep your sleeping area dark and cool. ? Avoid exercising during the few hours before you go to bed. ? Avoid caffeine products in the evening. General instructions  Work with your health care provider to determine what you  need help with and what your safety needs are.  If you were given a bracelet that tracks your location, make sure to wear it.  Keep all follow-up visits as told by your health care provider. This is important.  If you have questions or would like additional support, you may contact The Alzheimer's Association: ? 24-hour helpline: ([phone] [phone])293-0410 ? Website: [email or web address].huwww.alz.org Contact a health care provider if:  You have nausea, vomiting, or trouble with eating.  You have dizziness, or weakness.  You have new or worsening trouble with sleeping.  You or your family members become concerned for your safety. Get help right away if:  You develop chest pain or difficulty with breathing.  You pass out. This information is not intended to replace advice given to you by your health care provider. Make sure you discuss any questions you have with your health care provider. Document Released: 02/12/2004 Document Revised: 02/01/2016 Document Reviewed: 02/28/2015 Elsevier Interactive Patient Education  [employer]2018 Elsevier Inc.

## 2017-09-11 ENCOUNTER — Encounter (INDEPENDENT_AMBULATORY_CARE_PROVIDER_SITE_OTHER): Payer: Medicare Other | Admitting: Ophthalmology

## 2017-09-11 DIAGNOSIS — H353132 Nonexudative age-related macular degeneration, bilateral, intermediate dry stage: Secondary | ICD-10-CM | POA: Diagnosis not present

## 2017-09-11 DIAGNOSIS — H43813 Vitreous degeneration, bilateral: Secondary | ICD-10-CM | POA: Diagnosis not present

## 2017-12-15 ENCOUNTER — Ambulatory Visit
Admission: RE | Admit: 2017-12-15 | Discharge: 2017-12-15 | Disposition: A | Discharge status: Home / Self Care | Payer: Medicare Other | Source: Ambulatory Visit | Attending: Family Medicine | Admitting: Family Medicine

## 2017-12-15 ENCOUNTER — Other Ambulatory Visit: Payer: Self-pay | Admitting: Family Medicine

## 2017-12-15 DIAGNOSIS — R634 Abnormal weight loss: Secondary | ICD-10-CM

## 2017-12-15 DIAGNOSIS — R05 Cough: Secondary | ICD-10-CM

## 2017-12-15 DIAGNOSIS — R1084 Generalized abdominal pain: Secondary | ICD-10-CM

## 2017-12-15 DIAGNOSIS — R059 Cough, unspecified: Secondary | ICD-10-CM

## 2017-12-29 ENCOUNTER — Other Ambulatory Visit: Payer: Medicare Other

## 2018-01-15 NOTE — Progress Notes (Signed)
[facility]  PATIENT: Lydia Carr DOB: 01/17/1933   REASON FOR VISIT: Follow-up for memory loss HISTORY FROM: Patient and son    HISTORY OF PRESENT ILLNESS:UPDATE 8/5/2019CM Lydia Carr, 84 year old Carr returns for follow-up with her son.  She has mild memory loss/dementia likely due to Alzheimer's disease per formal neurocognitive testing.  She was started on Aricept but had GI side effects and stopped the medication about 2 months ago.  She did not call into the office.  She lives in a condominium complex with many elderly people.  It is not a retirement center.  She has help 4 hours 2 days a week but the son is trying to talk her into having more help.  Her son does her finances.  Patient feels her memory is the same, son thinks it has worsened a little.  She has medication oversight.  She returns for reevaluation. Interval history July 13, 2017: Patient returns today for follow-up for dementia.  Patient has completed formal neurocognitive testing that showed mild dementia most likely due to Alzheimer's disease.  She does have ongoing vascular risk factors with daily tobacco use and evidence of small vessel disease on brain MRI but her testing profile is more consistent with Alzheimer's dementia rather than vascular dementia.  She is in the mild stage at the present time.  She is on Aricept at this time.  Discussed moving to assisted living, which was also recommended by Dr. Alinda Dooms as well for more opportunities for socialization, mental stimulation and enhance quality of life.  I also recommend she discontinue driving, also suggested by Dr. Alinda Dooms.  She needs assistance with all complex ADLs, and medication management.  Provided information on Alzheimer's.org and other support groups, programs in the area. Had a long discussion with patient, daughter and son. She feels the Aricept has help.   HPI:  Lydia Carr is a 84 y.o. Carr here as a referral from Dr.  Duaine Dredge for dementia.PMHx COPD. Here with son and daughter in law. Patient feels like she is losing her memory. Started about 2 years ago and progressive. Patient says she started with having problems with dates, appointments, she had to write everything down. She can't remember how to cook things. She can't remember ingredients and has difficulty with executive function. No accidents. She gets lost if she goes too far but only drives 2 miles from her home. She is having more confusion. She repeats things. She lives independently in a town home, son helps her with finances. She forgets names. Repeats things in the same phone conversation. Denies any FHx of dementia, dad lived until 100 and possibly had memory issues unclear when it started. Mother was Lydia when she died no know FHx. No alcohol use, no drug use or previous history of addiction. She is a current smoker. 1PPD, 65 pack years. More short term memory loss reported. Son and daughter-in-law provide much information. No other focal neurologic deficits, associated symptoms, inciting events or modifiable factors.   REVIEW OF SYSTEMS: Full 14 system review of systems performed and notable only for those listed, all others are neg:  Constitutional: neg  Cardiovascular: neg Ear/Nose/Throat: neg  Skin: neg Eyes: neg Respiratory: neg Gastroitestinal: neg  Hematology/Lymphatic: neg  Endocrine: neg Musculoskeletal:neg Allergy/Immunology: neg Neurological: Memory loss Psychiatric: neg Sleep : neg   ALLERGIES: Allergies  Allergen Reactions  . Aricept [Donepezil Hcl] Nausea Only  . Other     Pt reports h/o allergies to drugs but is unable  to recall  . Penicillins Swelling    HOME MEDICATIONS: Outpatient Medications Prior to Visit  Medication Sig Dispense Refill  . ALPRAZolam (XANAX) 0.25 MG tablet Take 0.25 mg by mouth at bedtime as needed.    . Ascorbic Acid (VITAMIN C PO) Take by mouth.    . Cholecalciferol (VITAMIN D PO) Take by  mouth.    . Multiple Vitamins-Minerals (EYE VITAMINS PO) Take by mouth.    . polyethylene glycol powder (GLYCOLAX/MIRALAX) powder Take 1 Container by mouth daily as needed.    . vitamin B-12 (CYANOCOBALAMIN) 1000 MCG tablet Take 1,000 mcg by mouth daily.    . memantine (NAMENDA TITRATION PACK) tablet pack Take by mouth See admin instructions. 5 mg/day for =1 week; 5 mg twice daily for =1 week; 15 mg/day given in 5 mg and 10 mg separated doses for =1 week; then 10 mg twice daily    . memantine (NAMENDA) 5 MG tablet Take 5 mg by mouth 2 (two) times daily. 1 tab    . donepezil (ARICEPT) 10 MG tablet Take 1 tablet (10 mg total) by mouth at bedtime. (Patient not taking: Reported on 01/18/2018) 90 tablet 4   No facility-administered medications prior to visit.     PAST MEDICAL HISTORY: Past Medical History:  Diagnosis Date  . COPD (chronic obstructive pulmonary disease) (HCC)   . Macular degeneration     PAST SURGICAL HISTORY: Past Surgical History:  Procedure Laterality Date  . NO PAST SURGERIES      FAMILY HISTORY: Family History  Problem Relation Age of Onset  . Macular degeneration Brother   . Dementia Neg Hx     SOCIAL HISTORY: Social History   Socioeconomic History  . Marital status: Single    Spouse name: Not on file  . Number of children: 2  . Years of education: Not on file  . Highest education level: Bachelor's degree (e.g., BA, AB, BS)  Occupational History  . Not on file  Social Needs  . Financial resource strain: Not on file  . Food insecurity:    Worry: Not on file    Inability: Not on file  . Transportation needs:    Medical: Not on file    Non-medical: Not on file  Tobacco Use  . Smoking status: Current Every Day Smoker    Packs/day: 0.75    Types: Cigarettes  . Smokeless tobacco: Never Used  Substance and Sexual Activity  . Alcohol use: No  . Drug use: No  . Sexual activity: Not on file  Lifestyle  . Physical activity:    Days per week: Not on  file    Minutes per session: Not on file  . Stress: Not on file  Relationships  . Social connections:    Talks on phone: Not on file    Gets together: Not on file    Attends religious service: Not on file    Active member of club or organization: Not on file    Attends meetings of clubs or organizations: Not on file    Relationship status: Not on file  . Intimate partner violence:    Fear of current or ex partner: Not on file    Emotionally abused: Not on file    Physically abused: Not on file    Forced sexual activity: Not on file  Other Topics Concern  . Not on file  Social History Narrative   Lives at home alone   Right handed   Drinks caffeine daily (tea,  occasional coffee)     PHYSICAL EXAM  Vitals:   01/18/18 1328  BP: 107/67  Pulse: 91  Weight: 91 lb (41.3 kg)  Height: 5\' 2"  (1.575 m)   Body mass index is 16.64 kg/m.  Generalized: Well developed, in no acute distress  Head: normocephalic and atraumatic,. Oropharynx benign  Neck: Supple, no carotid bruits  Cardiac: Regular rate rhythm, no murmur  Musculoskeletal: No deformity   Neurological examination   Mentation: Alert.AFT 6 Clock drawing 4/4 MMSE - Mini Mental State Exam 01/18/2018 03/09/2017  Orientation to time 4 3  Orientation to Place 4 4  Registration 3 3  Attention/ Calculation 2 1  Recall 0 0  Language- name 2 objects 2 2  Language- repeat 1 1  Language- follow 3 step command 3 3  Language- read & follow direction 1 1  Write a sentence 0 1  Copy design 1 1  Total score 21 20   Follows all commands speech and language fluent.   Cranial nerve II-XII: .Pupils were equal round reactive to light extraocular movements were full, visual field were full on confrontational test. Facial sensation and strength were normal. hearing was intact to finger rubbing bilaterally. Uvula tongue midline. head turning and shoulder shrug were normal and symmetric.Tongue protrusion into cheek strength was  normal. Motor: normal bulk and tone, full strength in the BUE, BLE,  Sensory: normal and symmetric to light touch,  Coordination: finger-nose-finger, heel-to-shin bilaterally, no dysmetria Reflexes: Symmetric upper and lower, plantar responses were flexor bilaterally. Gait and Station: Rising up from seated position without assistance, normal stance,  moderate stride, good arm swing, smooth turning, able to perform tiptoe, and heel walking without difficulty. Tandem gait is unsteady.  No assistive device  DIAGNOSTIC DATA (LABS, IMAGING, TESTING) - I reviewed patient records, labs, notes, testing and imaging myself where available.  Lab Results  Component Value Date   WBC 9.3 05/26/2006   HGB 10.3 (L) 05/26/2006   HCT 32.4 (L) 05/26/2006   MCV 75.7 (L) 05/26/2006   PLT 382 05/26/2006      Component Value Date/Time   NA 144 05/05/2017 0909   K 4.3 05/05/2017 0909   CL 102 05/05/2017 0909   CO2 27 05/05/2017 0909   GLUCOSE 102 (H) 05/05/2017 0909   GLUCOSE 90 05/26/2006 0736   BUN 19 05/05/2017 0909   CREATININE 0.66 05/05/2017 0909   CALCIUM 10.0 05/05/2017 0909   PROT 6.3 05/26/2006 0736   ALBUMIN 4.2 05/26/2006 0736   AST 15 05/26/2006 0736   ALT 13 05/26/2006 0736   ALKPHOS 68 05/26/2006 0736   BILITOT 0.7 05/26/2006 0736   GFRNONAA 83 05/05/2017 0909   GFRAA 95 05/05/2017 0909   ASSESSMENT AND PLAN This is an 84 year old Carr with progressive memory loss more short-term memory and executive function difficulties. Exam is nonfocal. Formal neurocognitive testing consistent with mild Alzheimer's disease  MRI of the brain did show extensive T2 FLAIR microvascular changes however neurocognitive testing consistent with Alzheimer's and not vascular dementia.  We do recommend cessation of smoking and management of vascular risk factors due to these changes in the brain and daily aspirin.  May also have cerebral amyloid angiopathy.  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  No vascular malformation was identified.  Discontinued Aricept due to GI side effects Try Namenda 5 mg daily for 1 week then 1 twice a day for 1  week then 1 in the AM 2 in the PM for 1 week then 2 tablets twice a day.  Memory score is stable B12 is followed by her primary care to monitor levels Recommend adding  in-home care 3-4 times a week.  Recommend [facility] Highly recommend no driving Follow-up 6 to 8 months Nilda RiggsNancy Carolyn Tesia Lybrand, [facility] LLCGNP, [facility] InstituteBC, APRN  [facility] [address], [address] [place], [place] [id number] [phone]

## 2018-01-18 ENCOUNTER — Encounter: Payer: Self-pay | Admitting: Nurse Practitioner

## 2018-01-18 ENCOUNTER — Ambulatory Visit (INDEPENDENT_AMBULATORY_CARE_PROVIDER_SITE_OTHER): Payer: Medicare Other | Admitting: Nurse Practitioner

## 2018-01-18 VITALS — BP 107/67 | HR 91 | Ht 62.0 in | Wt 91.0 lb

## 2018-01-18 DIAGNOSIS — F0390 Unspecified dementia without behavioral disturbance: Secondary | ICD-10-CM | POA: Diagnosis not present

## 2018-01-18 MED ORDER — MEMANTINE HCL 5 MG PO TABS: ORAL_TABLET | ORAL | 6 refills | Status: AC

## 2018-01-18 NOTE — Patient Instructions (Signed)
Discontinued Aricept due to side effects Tried Namenda 5 mg daily for 1 week then 1 twice a day for 1 week then 1 in the AM 2 in the PM for 1 week then 2 tablets twice a day memory score is stable Memory score is stable Follow-up 6 to 8 months

## 2018-01-21 ENCOUNTER — Telehealth: Payer: Self-pay

## 2018-01-21 NOTE — Telephone Encounter (Signed)
PA submitted to Cover My Meds for Memantine 5 mg.  PA number **[id number]**. Determination should be made within 72 hours. Will follow on Cover My Meds.  MB RN

## 2018-01-21 NOTE — Telephone Encounter (Signed)
PA approval received via fax from cover my meds. REF number: [id number] Memantine 5 mg approved for a total of 4 tablets daily until 06/15/2018  Current Medication directions:1 daily x1 week then 1 tablet twice daily for 1 week , then 1 AM 2 in the PM for 1 week then 2 tablets twice a day  [facility] on [address] notified of approval. MB, RN

## 2018-01-27 NOTE — Progress Notes (Signed)
made any corrections needed to history, physical, neuro exam,assessment and agree with plan as stated above.    Hamlet Lasecki, MD 

## 2018-04-16 ENCOUNTER — Other Ambulatory Visit: Payer: Self-pay | Admitting: Family Medicine

## 2018-04-16 DIAGNOSIS — M5442 Lumbago with sciatica, left side: Secondary | ICD-10-CM

## 2018-04-28 ENCOUNTER — Ambulatory Visit
Admission: RE | Admit: 2018-04-28 | Discharge: 2018-04-28 | Disposition: A | Discharge status: Home / Self Care | Payer: Medicare Other | Source: Ambulatory Visit | Attending: Family Medicine | Admitting: Family Medicine

## 2018-04-28 DIAGNOSIS — M5442 Lumbago with sciatica, left side: Secondary | ICD-10-CM

## 2018-04-28 MED ORDER — IOPAMIDOL (ISOVUE-M 200) INJECTION 41%
1.0000 mL | Freq: Once | INTRAMUSCULAR | Status: AC
  Administered 2018-04-28: 1 mL via EPIDURAL

## 2018-04-28 MED ORDER — METHYLPREDNISOLONE ACETATE 40 MG/ML INJ SUSP (RADIOLOG
120.0000 mg | Freq: Once | INTRAMUSCULAR | Status: AC
  Administered 2018-04-28: 120 mg via EPIDURAL

## 2018-04-28 NOTE — Discharge Instructions (Signed)

## 2018-08-24 ENCOUNTER — Ambulatory Visit: Payer: Medicare Other | Admitting: Neurology

## 2018-08-24 ENCOUNTER — Ambulatory Visit: Payer: Medicare Other | Admitting: Nurse Practitioner

## 2018-11-21 IMAGING — XA Imaging study
2 series · 2 of 2 positions shown · non-contrast
Comparison: none

CLINICAL DATA: Low back pain.  LEFT leg pain.

[Series 1: ortho adipose · 1 of 1 slices shown (1 of 2)]
[im 1/1]
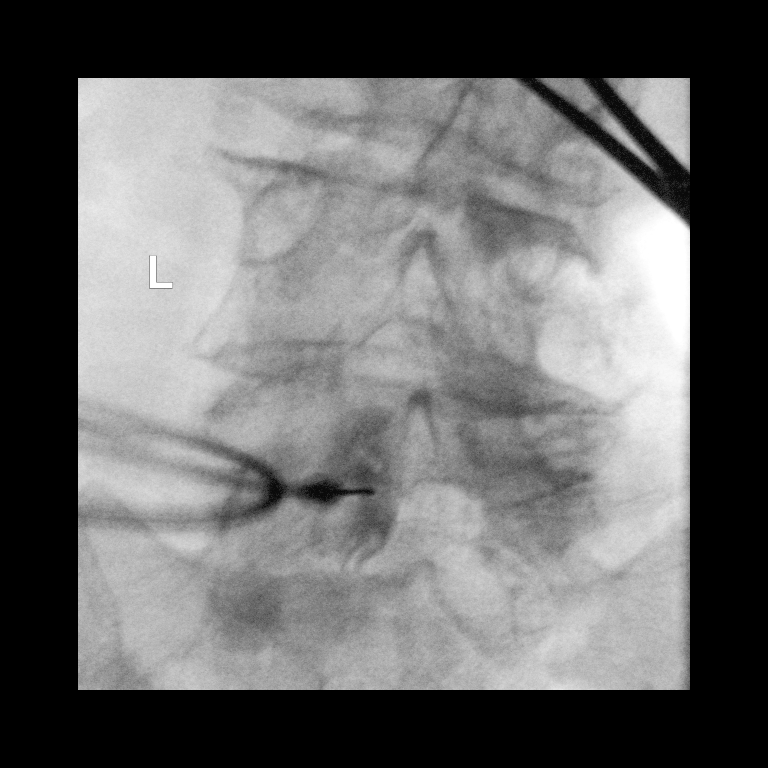

[Series 2: ortho adipose · 1 of 1 slices shown (2 of 2)]
[im 1/1]
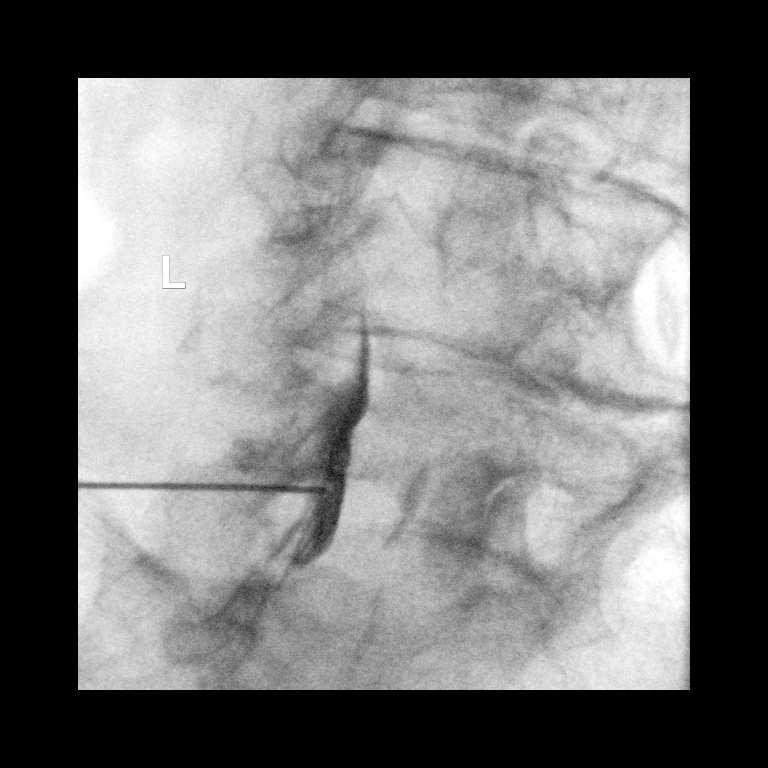

[2 of 2 positions shown; findings below may reference images not displayed]

FLUOROSCOPY TIME:  11 seconds corresponding to a Dose Area Product
of 5.5 Gy*m2

PROCEDURE:
The procedure, risks, benefits, and alternatives were explained to
the patient. Questions regarding the procedure were encouraged and
answered. The patient understands and consents to the procedure.

LUMBAR EPIDURAL INJECTION:

An interlaminar approach was performed on LEFT at L5-S1. The
overlying skin was cleansed and anesthetized. A 20 gauge epidural
needle was advanced using loss-of-resistance technique.

DIAGNOSTIC EPIDURAL INJECTION:

Injection of Isovue-M 200 shows a good epidural pattern with spread
above and below the level of needle placement, primarily on the
LEFT; no vascular opacification is seen.

THERAPEUTIC EPIDURAL INJECTION:

120.0 mg of Depo-Medrol mixed with 2 mL 1% lidocaine were instilled.
The procedure was well-tolerated, and the patient was discharged
thirty minutes following the injection in good condition.

COMPLICATIONS:
None
IMPRESSION: Technically successful epidural injection on the LEFT L5-S1 # 1.
